# Patient Record
Sex: Male | Born: 1960 | Race: Black or African American | Hispanic: No | Smoking: Never smoker
Health system: Southern US, Community
[De-identification: ages and names within clinical notes are randomized; demographics above are authoritative.]

## PROBLEM LIST (undated history)

## (undated) DIAGNOSIS — E7800 Pure hypercholesterolemia, unspecified: Secondary | ICD-10-CM

## (undated) DIAGNOSIS — K4090 Unilateral inguinal hernia, without obstruction or gangrene, not specified as recurrent: Secondary | ICD-10-CM

## (undated) DIAGNOSIS — E119 Type 2 diabetes mellitus without complications: Secondary | ICD-10-CM

## (undated) DIAGNOSIS — I10 Essential (primary) hypertension: Secondary | ICD-10-CM

## (undated) DIAGNOSIS — C801 Malignant (primary) neoplasm, unspecified: Secondary | ICD-10-CM

## (undated) DIAGNOSIS — S02609A Fracture of mandible, unspecified, initial encounter for closed fracture: Secondary | ICD-10-CM

## (undated) HISTORY — PX: HERNIA REPAIR: SHX51

## (undated) HISTORY — PX: TRACHEOSTOMY: SUR1362

---

## 2008-01-31 ENCOUNTER — Inpatient Hospital Stay (HOSPITAL_COMMUNITY): Admission: EM | Admit: 2008-01-31 | Discharge: 2008-02-15 | Payer: Self-pay | Admitting: Emergency Medicine

## 2008-02-29 ENCOUNTER — Ambulatory Visit (HOSPITAL_COMMUNITY): Admission: RE | Admit: 2008-02-29 | Discharge: 2008-02-29 | Payer: Self-pay | Admitting: General Surgery

## 2008-03-19 ENCOUNTER — Ambulatory Visit (HOSPITAL_COMMUNITY): Admission: RE | Admit: 2008-03-19 | Discharge: 2008-03-19 | Payer: Self-pay | Admitting: Otolaryngology

## 2008-03-28 ENCOUNTER — Ambulatory Visit (HOSPITAL_BASED_OUTPATIENT_CLINIC_OR_DEPARTMENT_OTHER): Admission: RE | Admit: 2008-03-28 | Discharge: 2008-03-28 | Payer: Self-pay | Admitting: Otolaryngology

## 2008-05-02 ENCOUNTER — Ambulatory Visit (HOSPITAL_COMMUNITY): Admission: RE | Admit: 2008-05-02 | Discharge: 2008-05-02 | Payer: Self-pay | Admitting: Surgery
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
## 2011-01-27 NOTE — Op Note (Signed)
Stephen Craig, AWBREY                 ACCOUNT NO.:  [id number]   MEDICAL RECORD NO.:  [id number]          PATIENT TYPE:  AMB   LOCATION:  DAY                          FACILITY:  [facility]   PHYSICIAN:  Wilmon Arms. Corliss Skains, M.D. DATE OF BIRTH:  1961-04-24   DATE OF PROCEDURE:  05/02/2008  DATE OF DISCHARGE:                               OPERATIVE REPORT   PREOPERATIVE DIAGNOSIS:  Right inguinal hernia.   POSTOPERATIVE DIAGNOSIS:  Right direct inguinal hernia.   PROCEDURE:  Right inguinal hernia repair with mesh.   SURGEON:  Wilmon Arms. Tsuei, M.D.   ANESTHESIA:  General.   INDICATIONS:  The patient is a 50 year old male who has had a right  inguinal hernia for several years.  It has gotten larger and has become  somewhat uncomfortable.  He presents now for elective repair.   DESCRIPTION OF PROCEDURE:  The patient brought to the operating room,  placed in the supine position on operating room table.  After an  adequate level of general anesthesia was obtained, the patient's right  groin was shaved, prepped with Betadine and draped in sterile fashion.  Time-out was taken to ensure proper patient and proper procedure.  The  area above the right inguinal ligament was infiltrated 25% Marcaine with  epinephrine.  An oblique incision was made above the inguinal ligament.  Dissection was carried down subcutaneous tissues with cautery.  We  dissected down to the external oblique fascia which was opened along the  direction of its fibers down to the external ring.  A large hernia bulge  was seen coming through the external ring.  We bluntly dissected around  the hernia sac.  This was a very large direct hernia sac which was  adherent to the spermatic cord.  We dissected this free from the  spermatic cord.  There was no evidence of an indirect hernia sac.  We  reduced the direct hernia and held in place with a sponge stick.  We  then reapproximated the floor of the inguinal canal with a 0 PDS  suture.  Ultrapro mesh was cut in a keyhole shape and secured beginning at the  pubic tubercle with 2-0 Prolene suture.  We ran this along the internal  oblique fascia superiorly and the shelving edge inferiorly.  The tails  sutured together behind the spermatic cord and tucked down underneath  the external oblique fascia.  The fascia was reapproximated with 2-0  Vicryl.  I used 3-0 Vicryls to close subcutaneous tissues and 4-0  Monocryl was used to close the skin.  Steri-Strips and clean dressings  were applied.  The patient was then extubated and brought to the  recovery room in stable condition.  All sponge, instrument and needle  counts were correct.      Wilmon Arms. Tsuei, M.D.  Electronically Signed     MKT/MEDQ  D:  05/02/2008  T:  05/02/2008  Job:  [id number]

## 2011-06-10 LAB — URINALYSIS, ROUTINE W REFLEX MICROSCOPIC
Bilirubin Urine: NEGATIVE
Bilirubin Urine: NEGATIVE
Glucose, UA: NEGATIVE
Glucose, UA: NEGATIVE
Ketones, ur: NEGATIVE
Protein, ur: 30 — AB
Specific Gravity, Urine: 1.021
pH: 6

## 2011-06-10 LAB — CBC
HCT: 35 — ABNORMAL LOW
HCT: 36.8 — ABNORMAL LOW
HCT: 37.5 — ABNORMAL LOW
HCT: 40
Hemoglobin: 11.8 — ABNORMAL LOW
Hemoglobin: 12.1 — ABNORMAL LOW
Hemoglobin: 12.6 — ABNORMAL LOW
Hemoglobin: 13.6
MCHC: 33.7
MCHC: 34
MCHC: 34.3
MCV: 89.3
MCV: 89.7
Platelets: 172
Platelets: 218
RBC: 3.71 — ABNORMAL LOW
RBC: 3.91 — ABNORMAL LOW
RBC: 4.1 — ABNORMAL LOW
RDW: 12.9
RDW: 13.5
WBC: 10.6 — ABNORMAL HIGH
WBC: 8.6
WBC: 9.8

## 2011-06-10 LAB — BASIC METABOLIC PANEL
CO2: 30
Calcium: 8.1 — ABNORMAL LOW
Chloride: 99
Creatinine, Ser: 1.12
GFR calc Af Amer: >60
GFR calc non Af Amer: 57 — ABNORMAL LOW
GFR calc non Af Amer: >60
Glucose, Bld: 122 — ABNORMAL HIGH
Glucose, Bld: 189 — ABNORMAL HIGH
Potassium: 3.8
Potassium: 4.3
Sodium: 137
Sodium: 138

## 2011-06-10 LAB — POCT I-STAT 3, ART BLOOD GAS (G3+)
Acid-base deficit: 4 — ABNORMAL HIGH
Bicarbonate: 18.9 — ABNORMAL LOW
O2 Saturation: 100
Patient temperature: 98.6
TCO2: 20

## 2011-06-10 LAB — URINE MICROSCOPIC-ADD ON

## 2011-06-10 LAB — DIFFERENTIAL
Eosinophils Absolute: 0.2
Lymphocytes Relative: 10 — ABNORMAL LOW
Lymphs Abs: 1.1
Monocytes Relative: 11
Neutrophils Relative %: 77

## 2011-06-10 LAB — POCT I-STAT, CHEM 8
BUN: 24 — ABNORMAL HIGH
Calcium, Ion: 1.04 — ABNORMAL LOW
Creatinine, Ser: 1.3
Glucose, Bld: 214 — ABNORMAL HIGH
TCO2: 17

## 2011-06-12 LAB — POCT HEMOGLOBIN-HEMACUE: Hemoglobin: 13.7

## 2012-08-02 ENCOUNTER — Emergency Department (HOSPITAL_COMMUNITY)
Admission: EM | Admit: 2012-08-02 | Discharge: 2012-08-02 | Disposition: A | Discharge status: Home / Self Care | Payer: No Typology Code available for payment source | Attending: Emergency Medicine | Admitting: Emergency Medicine

## 2012-08-02 ENCOUNTER — Emergency Department (HOSPITAL_COMMUNITY): Payer: No Typology Code available for payment source

## 2012-08-02 ENCOUNTER — Encounter (HOSPITAL_COMMUNITY): Payer: Self-pay | Admitting: *Deleted

## 2012-08-02 DIAGNOSIS — M25559 Pain in unspecified hip: Secondary | ICD-10-CM

## 2012-08-02 DIAGNOSIS — R52 Pain, unspecified: Secondary | ICD-10-CM | POA: Insufficient documentation

## 2012-08-02 DIAGNOSIS — E7800 Pure hypercholesterolemia, unspecified: Secondary | ICD-10-CM | POA: Insufficient documentation

## 2012-08-02 DIAGNOSIS — E119 Type 2 diabetes mellitus without complications: Secondary | ICD-10-CM | POA: Insufficient documentation

## 2012-08-02 DIAGNOSIS — I10 Essential (primary) hypertension: Secondary | ICD-10-CM | POA: Insufficient documentation

## 2012-08-02 DIAGNOSIS — Z79899 Other long term (current) drug therapy: Secondary | ICD-10-CM | POA: Insufficient documentation

## 2012-08-02 DIAGNOSIS — Z8781 Personal history of (healed) traumatic fracture: Secondary | ICD-10-CM | POA: Insufficient documentation

## 2012-08-02 HISTORY — DX: Fracture of mandible, unspecified, initial encounter for closed fracture: S02.609A

## 2012-08-02 HISTORY — DX: Pure hypercholesterolemia, unspecified: E78.00

## 2012-08-02 HISTORY — DX: Essential (primary) hypertension: I10

## 2012-08-02 HISTORY — DX: Type 2 diabetes mellitus without complications: E11.9

## 2012-08-02 MED ORDER — MORPHINE SULFATE 4 MG/ML IJ SOLN
4.0000 mg | Freq: Once | INTRAMUSCULAR | Status: AC
  Administered 2012-08-02: 4 mg via INTRAMUSCULAR
  Filled 2012-08-02: qty 1

## 2012-08-02 MED ORDER — KETOROLAC TROMETHAMINE 30 MG/ML IJ SOLN
30.0000 mg | Freq: Once | INTRAMUSCULAR | Status: AC
Start: 2012-08-02 — End: 2012-08-02
  Administered 2012-08-02: 30 mg via INTRAMUSCULAR
  Filled 2012-08-02: qty 1

## 2012-08-02 MED ORDER — ONDANSETRON 4 MG PO TBDP
4.0000 mg | ORAL_TABLET | Freq: Once | ORAL | Status: AC
  Administered 2012-08-02: 4 mg via ORAL
  Filled 2012-08-02: qty 1

## 2012-08-02 MED ORDER — ONDANSETRON HCL 8 MG PO TABS: 4.0000 mg | ORAL_TABLET | Freq: Once | ORAL | Status: DC

## 2012-08-02 MED ORDER — IBUPROFEN 200 MG PO TABS
400.0000 mg | ORAL_TABLET | Freq: Once | ORAL | Status: AC
  Administered 2012-08-02: 400 mg via ORAL
  Filled 2012-08-02: qty 2

## 2012-08-02 MED ORDER — OXYCODONE-ACETAMINOPHEN 5-325 MG PO TABS
1.0000 | ORAL_TABLET | Freq: Once | ORAL | Status: AC
  Administered 2012-08-02: 1 via ORAL
  Filled 2012-08-02: qty 1

## 2012-08-02 MED ORDER — NAPROXEN 375 MG PO TABS: 375.0000 mg | ORAL_TABLET | Freq: Two times a day (BID) | ORAL | Status: DC

## 2012-08-02 MED ORDER — HYDROCODONE-ACETAMINOPHEN 5-500 MG PO TABS: 1.0000 | ORAL_TABLET | Freq: Four times a day (QID) | ORAL | Status: DC | PRN

## 2012-08-02 NOTE — ED Notes (Addendum)
cbg reported as 129. To xray via w/c.

## 2012-08-02 NOTE — ED Notes (Signed)
The patient is AOx4 and comfortable with his discharge instructions.  His ride home is present. 

## 2012-08-02 NOTE — ED Notes (Addendum)
C/o R hip pain, sudden onset, pinpoints to R hip, denies radiation or discomforts in leg or foot. (denies: recent injury, nvd, fever, urinary sx, bleeding, back pain, weakness or other sx). Onset today. Worse with movement, walking or lifting leg. No meds PTA. Alert, NAD, calm, interactive, uncomfortable sitting in w/c, guarding R hip. Has had episodes of high BP in past, but denies having htn.

## 2012-08-02 NOTE — ED Provider Notes (Addendum)
History     CSN: [id number]  Arrival date & time 08/02/12  0050   First MD Initiated Contact with Patient 08/02/12 [phone]      Chief Complaint  Patient presents with  . Hip Pain    (Consider location/radiation/quality/duration/timing/severity/associated sxs/prior treatment) Patient is a 51 y.o. male presenting with hip pain. The history is provided by the patient.  Hip Pain This is a new problem. The current episode started yesterday. The problem occurs constantly. The problem has not changed since onset.Pertinent negatives include no chest pain, no abdominal pain and no shortness of breath. The symptoms are aggravated by bending. Nothing relieves the symptoms.    Past Medical History  Diagnosis Date  . Diabetes mellitus without complication   . Hypertension   . Hypercholesterolemia   . Mandible fracture     Past Surgical History  Procedure Date  . Tracheostomy   . Hernia repair     No family history on file.  History  Substance Use Topics  . Smoking status: Never Smoker   . Smokeless tobacco: Not on file  . Alcohol Use: No      Review of Systems  Respiratory: Negative for shortness of breath.   Cardiovascular: Negative for chest pain.  Gastrointestinal: Negative for abdominal pain.  All other systems reviewed and are negative.    Allergies  Review of patient's allergies indicates no known allergies.  Home Medications   Current Outpatient Rx  Name  Route  Sig  Dispense  Refill  . LISINOPRIL 10 MG PO TABS   Oral   Take 10 mg by mouth daily.         Marland Kitchen METFORMIN HCL 1000 MG PO TABS   Oral   Take 1,000 mg by mouth 2 (two) times daily with a meal.         . PRAVASTATIN SODIUM 40 MG PO TABS   Oral   Take 80 mg by mouth daily.           BP 173/107  Pulse 93  Temp 97.7 F (36.5 C) (Oral)  Resp 16  SpO2 98%  Physical Exam  Constitutional: He is oriented to person, place, and time. He appears well-developed and well-nourished.  HENT:    Head: Normocephalic and atraumatic.  Eyes: Conjunctivae normal are normal. Pupils are equal, round, and reactive to light.  Neck: Normal range of motion. Neck supple.  Cardiovascular: Normal rate, regular rhythm, normal heart sounds and intact distal pulses.   Pulmonary/Chest: Effort normal and breath sounds normal.  Abdominal: Soft. Bowel sounds are normal.  Musculoskeletal:       Rt hip tenderness  Neurological: He is alert and oriented to person, place, and time.  Skin: Skin is warm and dry.  Psychiatric: He has a normal mood and affect. His behavior is normal. Judgment and thought content normal.    ED Course  Procedures (including critical care time)  Labs Reviewed - No data to display Dg Hip Complete Right  08/02/2012  *RADIOLOGY REPORT*  Clinical Data: Right hip pain, no known injury  RIGHT HIP - COMPLETE 2+ VIEW  Comparison: None Correlation:  CT pelvis 02/01/2008  Findings: Symmetric hip and SI joints. Osseous mineralization grossly normal. No acute fracture, dislocation, or bone destruction. Small pelvic phleboliths. Bone island right pelvis stable.  IMPRESSION: No acute abnormalities.   Original Report Authenticated By: Ulyses Southward, M.D.      No diagnosis found.    MDM  + rt hip pain.  Hx of  diabetes,  No fever.  Xray neg.  Sugar not elevated.  Will analgesia, dc to oupt fu.  No cp, no sob, no leg swelling,   No sxs of epidural compression or vascular insufficency       Anyae Griffith Lytle Michaels, MD 08/02/12 0244  Dalma Panchal Lytle Michaels, MD 08/02/12 [phone]

## 2017-06-21 ENCOUNTER — Ambulatory Visit
Admission: RE | Admit: 2017-06-21 | Discharge: 2017-06-21 | Disposition: A | Discharge status: Home / Self Care | Payer: PRIVATE HEALTH INSURANCE | Source: Ambulatory Visit | Attending: Family Medicine | Admitting: Family Medicine

## 2017-06-21 ENCOUNTER — Other Ambulatory Visit: Payer: Self-pay | Admitting: Family Medicine

## 2017-06-21 DIAGNOSIS — M5489 Other dorsalgia: Secondary | ICD-10-CM

## 2017-07-28 ENCOUNTER — Other Ambulatory Visit: Payer: Self-pay | Admitting: Urology

## 2017-09-22 NOTE — Patient Instructions (Signed)
Your procedure is scheduled on: Friday, Jan. 18, 2019   Surgery Time:  7:30AM-11:30AM   Report to [facility]  Entrance   Follow map to Short Stay on first floor at 5:30 AM   Call this number if you have problems the morning of surgery [phone]   Do not eat food or drink liquids :After Midnight.   Do NOT smoke after Midnight   Do NOT take evening dose of Metformin the night before surgery    FLEETS ENEMA THE MORNING OF SURGERY   Take these medicines the morning of surgery with A SIP OF WATER: Pravastatin   DO NOT TAKE ANY DIABETIC MEDICATIONS DAY OF YOUR SURGERY                               You may not have any metal on your body including jewelry, and body piercings             Do not wear lotions, powders, perfumes/cologne, or deodorant                          Men may shave face and neck.   Do not bring valuables to the hospital. [facility].   Contacts, dentures or bridgework may not be worn into surgery.   Leave suitcase in the car. After surgery it may be brought to your room.     Special Instructions: Bring a copy of your healthcare power of attorney and living will documents         the day of surgery if you haven't scanned them in before.              Please read over the following fact sheets you were given:  [facility] - Preparing for Surgery Before surgery, you can play an important role.  Because skin is not sterile, your skin needs to be as free of germs as possible.  You can reduce the number of germs on your skin by washing with CHG (chlorahexidine gluconate) soap before surgery.  CHG is an antiseptic cleaner which kills germs and bonds with the skin to continue killing germs even after washing. Please DO NOT use if you have an allergy to CHG or antibacterial soaps.  If your skin becomes reddened/irritated stop using the CHG and inform your nurse when you arrive at Short Stay. Do not  shave (including legs and underarms) for at least 48 hours prior to the first CHG shower.  You may shave your face/neck.  Please follow these instructions carefully:  1.  Shower with CHG Soap the night before surgery and the  morning of surgery.  2.  If you choose to wash your hair, wash your hair first as usual with your normal  shampoo.  3.  After you shampoo, rinse your hair and body thoroughly to remove the shampoo.                             4.  Use CHG as you would any other liquid soap.  You can apply chg directly to the skin and wash.  Gently with a scrungie or clean washcloth.  5.  Apply the CHG Soap to your body ONLY FROM THE NECK DOWN.  Do   not use on face/ open                           Wound or open sores. Avoid contact with eyes, ears mouth and   genitals (private parts).                       Wash face,  Genitals (private parts) with your normal soap.             6.  Wash thoroughly, paying special attention to the area where your    surgery  will be performed.  7.  Thoroughly rinse your body with warm water from the neck down.  8.  DO NOT shower/wash with your normal soap after using and rinsing off the CHG Soap.                9.  Pat yourself dry with a clean towel.            10.  Wear clean pajamas.            11.  Place clean sheets on your bed the night of your first shower and do not  sleep with pets. Day of Surgery : Do not apply any lotions/deodorants the morning of surgery.  Please wear clean clothes to the hospital/surgery center.  FAILURE TO FOLLOW THESE INSTRUCTIONS MAY RESULT IN THE CANCELLATION OF YOUR SURGERY  PATIENT SIGNATURE_________________________________  NURSE SIGNATURE__________________________________  ________________________________________________________________________ How to Manage Your Diabetes Before and After Surgery  Why is it important to control my blood sugar before and after surgery? . Improving blood sugar levels before and  after surgery helps healing and can limit problems. . A way of improving blood sugar control is eating a healthy diet by: o  Eating less sugar and carbohydrates o  Increasing activity/exercise o  Talking with your doctor about reaching your blood sugar goals . High blood sugars (greater than 180 mg/dL) can raise your risk of infections and slow your recovery, so you will need to focus on controlling your diabetes during the weeks before surgery. . Make sure that the doctor who takes care of your diabetes knows about your planned surgery including the date and location.  How do I manage my blood sugar before surgery? . Check your blood sugar at least 4 times a day, starting 2 days before surgery, to make sure that the level is not too high or low. o Check your blood sugar the morning of your surgery when you wake up and every 2 hours until you get to the Short Stay unit. . If your blood sugar is less than 70 mg/dL, you will need to treat for low blood sugar: o Do not take insulin. o Treat a low blood sugar (less than 70 mg/dL) with  cup of clear juice (cranberry or apple), 4 glucose tablets, OR glucose gel. o Recheck blood sugar in 15 minutes after treatment (to make sure it is greater than 70 mg/dL). If your blood sugar is not greater than 70 mg/dL on recheck, call [phone] for further instructions. . Report your blood sugar to the short stay nurse when you get to Short Stay.  . If you are admitted to the hospital after surgery: o Your blood sugar will be checked by the staff and you will probably be given insulin after surgery (instead of oral diabetes medicines) to make sure you have good blood sugar  levels. o The goal for blood sugar control after surgery is 80-180 mg/dL.    WHAT IS A BLOOD TRANSFUSION? Blood Transfusion Information  A transfusion is the replacement of blood or some of its parts. Blood is made up of multiple cells which provide different functions.  Red blood cells  carry oxygen and are used for blood loss replacement.  White blood cells fight against infection.  Platelets control bleeding.  Plasma helps clot blood.  Other blood products are available for specialized needs, such as hemophilia or other clotting disorders. BEFORE THE TRANSFUSION  Who gives blood for transfusions?   Healthy volunteers who are fully evaluated to make sure their blood is safe. This is blood bank blood. Transfusion therapy is the safest it has ever been in the practice of medicine. Before blood is taken from a donor, a complete history is taken to make sure that person has no history of diseases nor engages in risky social behavior (examples are intravenous drug use or sexual activity with multiple partners). The donor's travel history is screened to minimize risk of transmitting infections, such as malaria. The donated blood is tested for signs of infectious diseases, such as HIV and hepatitis. The blood is then tested to be sure it is compatible with you in order to minimize the chance of a transfusion reaction. If you or a relative donates blood, this is often done in anticipation of surgery and is not appropriate for emergency situations. It takes many days to process the donated blood. RISKS AND COMPLICATIONS Although transfusion therapy is very safe and saves many lives, the main dangers of transfusion include:   Getting an infectious disease.  Developing a transfusion reaction. This is an allergic reaction to something in the blood you were given. Every precaution is taken to prevent this. The decision to have a blood transfusion has been considered carefully by your caregiver before blood is given. Blood is not given unless the benefits outweigh the risks. AFTER THE TRANSFUSION  Right after receiving a blood transfusion, you will usually feel much better and more energetic. This is especially true if your red blood cells have gotten low (anemic). The transfusion raises  the level of the red blood cells which carry oxygen, and this usually causes an energy increase.  The nurse administering the transfusion will monitor you carefully for complications. HOME CARE INSTRUCTIONS  No special instructions are needed after a transfusion. You may find your energy is better. Speak with your caregiver about any limitations on activity for underlying diseases you may have. SEEK MEDICAL CARE IF:   Your condition is not improving after your transfusion.  You develop redness or irritation at the intravenous (IV) site. SEEK IMMEDIATE MEDICAL CARE IF:  Any of the following symptoms occur over the next 12 hours:  Shaking chills.  You have a temperature by mouth above 102 F (38.9 C), not controlled by medicine.  Chest, back, or muscle pain.  People around you feel you are not acting correctly or are confused.  Shortness of breath or difficulty breathing.  Dizziness and fainting.  You get a rash or develop hives.  You have a decrease in urine output.  Your urine turns a dark color or changes to pink, red, or brown. Any of the following symptoms occur over the next 10 days:  You have a temperature by mouth above 102 F (38.9 C), not controlled by medicine.  Shortness of breath.  Weakness after normal activity.  The white part of the  eye turns yellow (jaundice).  You have a decrease in the amount of urine or are urinating less often.  Your urine turns a dark color or changes to pink, red, or brown. Document Released: 08/28/2000 Document Revised: 11/23/2011 Document Reviewed: 04/16/2008 [facility] Patient Information 2014 [place], [place].  _______________________________________________________________________

## 2017-09-24 ENCOUNTER — Inpatient Hospital Stay (HOSPITAL_COMMUNITY)
Admission: RE | Admit: 2017-09-24 | Discharge: 2017-09-24 | Disposition: A | Discharge status: Home / Self Care | Payer: PRIVATE HEALTH INSURANCE | Source: Ambulatory Visit

## 2017-09-24 NOTE — Patient Instructions (Signed)
Stephen Craig  09/24/2017   Your procedure is scheduled on: 10/01/17   Report to [facility] Main  Entrance  Follow signs to Short Stay on first floor at 530 AM  Call this number if you have problems the morning of surgery [phone]   Remember: Do not eat food or drink liquids :After Midnight.  Fleets enema am of surgery    Take these medicines the morning of surgery with A SIP OF WATER: Pravastatin  DO NOT TAKE ANY DIABETIC MEDICATIONS DAY OF YOUR SURGERY                               You may not have any metal on your body including hair pins and              piercings  Do not wear jewelry, make-up, lotions, powders or perfumes, deodorant             Do not wear nail polish.  Do not shave  48 hours prior to surgery.              Men may shave face and neck.   Do not bring valuables to the hospital. [place].  Contacts, dentures or bridgework may not be worn into surgery.  Leave suitcase in the car. After surgery it may be brought to your room.     Patients discharged the day of surgery will not be allowed to drive home.  Name and phone number of your driver:  Special Instructions: N/A              Please read over the following fact sheets you were given: _____________________________________________________________________             [facility] - Preparing for Surgery Before surgery, you can play an important role.  Because skin is not sterile, your skin needs to be as free of germs as possible.  You can reduce the number of germs on your skin by washing with CHG (chlorahexidine gluconate) soap before surgery.  CHG is an antiseptic cleaner which kills germs and bonds with the skin to continue killing germs even after washing. Please DO NOT use if you have an allergy to CHG or antibacterial soaps.  If your skin becomes reddened/irritated stop using the CHG and inform your nurse when you arrive at  Short Stay. Do not shave (including legs and underarms) for at least 48 hours prior to the first CHG shower.  You may shave your face/neck. Please follow these instructions carefully:  1.  Shower with CHG Soap the night before surgery and the  morning of Surgery.  2.  If you choose to wash your hair, wash your hair first as usual with your  normal  shampoo.  3.  After you shampoo, rinse your hair and body thoroughly to remove the  shampoo.                           4.  Use CHG as you would any other liquid soap.  You can apply chg directly  to the skin and wash  Gently with a scrungie or clean washcloth.  5.  Apply the CHG Soap to your body ONLY FROM THE NECK DOWN.   Do not use on face/ open                           Wound or open sores. Avoid contact with eyes, ears mouth and genitals (private parts).                       Wash face,  Genitals (private parts) with your normal soap.             6.  Wash thoroughly, paying special attention to the area where your surgery  will be performed.  7.  Thoroughly rinse your body with warm water from the neck down.  8.  DO NOT shower/wash with your normal soap after using and rinsing off  the CHG Soap.                9.  Pat yourself dry with a clean towel.            10.  Wear clean pajamas.            11.  Place clean sheets on your bed the night of your first shower and do not  sleep with pets. Day of Surgery : Do not apply any lotions/deodorants the morning of surgery.  Please wear clean clothes to the hospital/surgery center.  FAILURE TO FOLLOW THESE INSTRUCTIONS MAY RESULT IN THE CANCELLATION OF YOUR SURGERY PATIENT SIGNATURE_________________________________  NURSE SIGNATURE__________________________________  ________________________________________________________________________  WHAT IS A BLOOD TRANSFUSION? Blood Transfusion Information  A transfusion is the replacement of blood or some of its parts. Blood is made up  of multiple cells which provide different functions.  Red blood cells carry oxygen and are used for blood loss replacement.  White blood cells fight against infection.  Platelets control bleeding.  Plasma helps clot blood.  Other blood products are available for specialized needs, such as hemophilia or other clotting disorders. BEFORE THE TRANSFUSION  Who gives blood for transfusions?   Healthy volunteers who are fully evaluated to make sure their blood is safe. This is blood bank blood. Transfusion therapy is the safest it has ever been in the practice of medicine. Before blood is taken from a donor, a complete history is taken to make sure that person has no history of diseases nor engages in risky social behavior (examples are intravenous drug use or sexual activity with multiple partners). The donor's travel history is screened to minimize risk of transmitting infections, such as malaria. The donated blood is tested for signs of infectious diseases, such as HIV and hepatitis. The blood is then tested to be sure it is compatible with you in order to minimize the chance of a transfusion reaction. If you or a relative donates blood, this is often done in anticipation of surgery and is not appropriate for emergency situations. It takes many days to process the donated blood. RISKS AND COMPLICATIONS Although transfusion therapy is very safe and saves many lives, the main dangers of transfusion include:   Getting an infectious disease.  Developing a transfusion reaction. This is an allergic reaction to something in the blood you were given. Every precaution is taken to prevent this. The decision to have a blood transfusion has been considered carefully by your caregiver before blood is given. Blood is not given unless the benefits outweigh  the risks. AFTER THE TRANSFUSION  Right after receiving a blood transfusion, you will usually feel much better and more energetic. This is especially true  if your red blood cells have gotten low (anemic). The transfusion raises the level of the red blood cells which carry oxygen, and this usually causes an energy increase.  The nurse administering the transfusion will monitor you carefully for complications. HOME CARE INSTRUCTIONS  No special instructions are needed after a transfusion. You may find your energy is better. Speak with your caregiver about any limitations on activity for underlying diseases you may have. SEEK MEDICAL CARE IF:   Your condition is not improving after your transfusion.  You develop redness or irritation at the intravenous (IV) site. SEEK IMMEDIATE MEDICAL CARE IF:  Any of the following symptoms occur over the next 12 hours:  Shaking chills.  You have a temperature by mouth above 102 F (38.9 C), not controlled by medicine.  Chest, back, or muscle pain.  People around you feel you are not acting correctly or are confused.  Shortness of breath or difficulty breathing.  Dizziness and fainting.  You get a rash or develop hives.  You have a decrease in urine output.  Your urine turns a dark color or changes to pink, red, or brown. Any of the following symptoms occur over the next 10 days:  You have a temperature by mouth above 102 F (38.9 C), not controlled by medicine.  Shortness of breath.  Weakness after normal activity.  The white part of the eye turns yellow (jaundice).  You have a decrease in the amount of urine or are urinating less often.  Your urine turns a dark color or changes to pink, red, or brown. Document Released: 08/28/2000 Document Revised: 11/23/2011 Document Reviewed: 04/16/2008 [facility] Patient Information 2014 [place], [place].  _______________________________________________________________________

## 2017-09-27 ENCOUNTER — Encounter (INDEPENDENT_AMBULATORY_CARE_PROVIDER_SITE_OTHER): Payer: Self-pay

## 2017-09-27 ENCOUNTER — Inpatient Hospital Stay (HOSPITAL_COMMUNITY)
Admission: RE | Admit: 2017-09-27 | Discharge: 2017-09-27 | Disposition: A | Discharge status: Home / Self Care | Payer: PRIVATE HEALTH INSURANCE | Source: Ambulatory Visit

## 2017-09-27 ENCOUNTER — Encounter (HOSPITAL_COMMUNITY): Payer: Self-pay

## 2017-09-27 ENCOUNTER — Other Ambulatory Visit: Payer: Self-pay

## 2017-09-27 ENCOUNTER — Encounter (HOSPITAL_COMMUNITY)
Admission: RE | Admit: 2017-09-27 | Discharge: 2017-09-27 | Disposition: A | Discharge status: Home / Self Care | Payer: PRIVATE HEALTH INSURANCE | Source: Ambulatory Visit | Attending: Urology | Admitting: Urology

## 2017-09-27 DIAGNOSIS — E119 Type 2 diabetes mellitus without complications: Secondary | ICD-10-CM | POA: Diagnosis not present

## 2017-09-27 DIAGNOSIS — Z01812 Encounter for preprocedural laboratory examination: Secondary | ICD-10-CM | POA: Diagnosis not present

## 2017-09-27 DIAGNOSIS — Z7984 Long term (current) use of oral hypoglycemic drugs: Secondary | ICD-10-CM | POA: Insufficient documentation

## 2017-09-27 DIAGNOSIS — Z01810 Encounter for preprocedural cardiovascular examination: Secondary | ICD-10-CM | POA: Diagnosis present

## 2017-09-27 DIAGNOSIS — C61 Malignant neoplasm of prostate: Secondary | ICD-10-CM | POA: Insufficient documentation

## 2017-09-27 DIAGNOSIS — I10 Essential (primary) hypertension: Secondary | ICD-10-CM | POA: Insufficient documentation

## 2017-09-27 HISTORY — DX: Unilateral inguinal hernia, without obstruction or gangrene, not specified as recurrent: K40.90

## 2017-09-27 HISTORY — DX: Malignant (primary) neoplasm, unspecified: C80.1

## 2017-09-27 LAB — COMPREHENSIVE METABOLIC PANEL
ALBUMIN: 3.7 g/dL (ref 3.5–5.0)
ALK PHOS: 66 U/L (ref 38–126)
ALT: 15 U/L — AB (ref 17–63)
ANION GAP: 8 (ref 5–15)
AST: 21 U/L (ref 15–41)
BUN: 22 mg/dL — ABNORMAL HIGH (ref 6–20)
CALCIUM: 9.2 mg/dL (ref 8.9–10.3)
CHLORIDE: 106 mmol/L (ref 101–111)
CO2: 25 mmol/L (ref 22–32)
CREATININE: 1.37 mg/dL — AB (ref 0.61–1.24)
GFR calc Af Amer: >60 mL/min (ref >60)
GFR calc non Af Amer: 56 mL/min — ABNORMAL LOW (ref >60)
GLUCOSE: 183 mg/dL — AB (ref 65–99)
Potassium: 3.5 mmol/L (ref 3.5–5.1)
Sodium: 139 mmol/L (ref 135–145)
Total Bilirubin: 0.4 mg/dL (ref 0.3–1.2)
Total Protein: 7.2 g/dL (ref 6.5–8.1)

## 2017-09-27 LAB — CBC
HCT: 41 % (ref 39.0–52.0)
HEMOGLOBIN: 13.3 g/dL (ref 13.0–17.0)
MCH: 29 pg (ref 26.0–34.0)
MCHC: 32.4 g/dL (ref 30.0–36.0)
MCV: 89.5 fL (ref 78.0–100.0)
PLATELETS: 238 10*3/uL (ref 150–400)
RBC: 4.58 MIL/uL (ref 4.22–5.81)
RDW: 13.4 % (ref 11.5–15.5)
WBC: 6 10*3/uL (ref 4.0–10.5)

## 2017-09-27 LAB — ABO/RH: ABO/RH(D): B POS

## 2017-09-27 LAB — GLUCOSE, CAPILLARY: GLUCOSE-CAPILLARY: 189 mg/dL — AB (ref 65–99)

## 2017-09-27 LAB — HEMOGLOBIN A1C
Hgb A1c MFr Bld: 7 % — ABNORMAL HIGH (ref 4.8–5.6)
Mean Plasma Glucose: 154.2 mg/dL

## 2017-09-27 MED ORDER — FLEET ENEMA 7-19 GM/118ML RE ENEM
1.0000 | ENEMA | Freq: Once | RECTAL | Status: DC
  Filled 2017-09-27: qty 1

## 2017-09-27 NOTE — Pre-Procedure Instructions (Signed)
CMP and Hgb A1C 09/27/17 results faxed to Dr. Louis Meckel and Dr. Alroy Dust via epic.

## 2017-09-27 NOTE — Patient Instructions (Signed)
Your procedure is scheduled on: Friday, Jan. 18, 2019   Surgery Time:  7:30AM-11:30AM   Report to [facility]   Follow map to Short Stay on first floor at 5:30 AM   Call this number if you have problems the morning of surgery [phone]   Do not eat food or drink liquids :After Midnight.   Do NOT smoke after [place]   Take these medicines the morning of surgery with A SIP OF WATER: Pravastatin   DO NOT TAKE EVENING DOSE OF METFORMIN THE NIGHT BEFORE SURGERY   DO NOT TAKE ANY DIABETIC MEDICATIONS DAY OF YOUR SURGERY                               You may not have any metal on your body including jewelry, and body piercings             Do not wear make-up, lotions, powders, perfumes/cologne, or deodorant                          Men may shave face and neck.   Do not bring valuables to the hospital. [place].   Contacts, dentures or bridgework may not be worn into surgery.   Leave suitcase in the car. After surgery it may be brought to your room.   Special Instructions: Bring a copy of your healthcare power of attorney and living will documents         the day of surgery if you haven't scanned them in before.              Please read over the following fact sheets you were given:   [facility] - Preparing for Surgery Before surgery, you can play an important role.  Because skin is not sterile, your skin needs to be as free of germs as possible.  You can reduce the number of germs on your skin by washing with CHG (chlorahexidine gluconate) soap before surgery.  CHG is an antiseptic cleaner which kills germs and bonds with the skin to continue killing germs even after washing. Please DO NOT use if you have an allergy to CHG or antibacterial soaps.  If your skin becomes reddened/irritated stop using the CHG and inform your nurse when you arrive at Short Stay. Do  not shave (including legs and underarms) for at least 48 hours prior to the first CHG shower.  You may shave your face/neck.  Please follow these instructions carefully:  1.  Shower with CHG Soap the night before surgery and the  morning of surgery.  2.  If you choose to wash your hair, wash your hair first as usual with your normal  shampoo.  3.  After you shampoo, rinse your hair and body thoroughly to remove the shampoo.                             4.  Use CHG as you would any other liquid soap.  You can apply chg directly to the skin and wash.  Gently with a scrungie or clean washcloth.  5.  Apply the CHG Soap to your body ONLY FROM THE NECK DOWN.  Do   not use on face/ open                           Wound or open sores. Avoid contact with eyes, ears mouth and   genitals (private parts).                       Wash face,  Genitals (private parts) with your normal soap.             6.  Wash thoroughly, paying special attention to the area where your    surgery  will be performed.  7.  Thoroughly rinse your body with warm water from the neck down.  8.  DO NOT shower/wash with your normal soap after using and rinsing off the CHG Soap.                9.  Pat yourself dry with a clean towel.            10.  Wear clean pajamas.            11.  Place clean sheets on your bed the night of your first shower and do not  sleep with pets. Day of Surgery : Do not apply any lotions/deodorants the morning of surgery.  Please wear clean clothes to the hospital/surgery center.  FAILURE TO FOLLOW THESE INSTRUCTIONS MAY RESULT IN THE CANCELLATION OF YOUR SURGERY  PATIENT SIGNATURE_________________________________  NURSE SIGNATURE__________________________________  ________________________________________________________________________

## 2017-10-01 ENCOUNTER — Encounter (HOSPITAL_COMMUNITY): Payer: Self-pay | Admitting: *Deleted

## 2017-10-01 ENCOUNTER — Other Ambulatory Visit: Payer: Self-pay

## 2017-10-01 ENCOUNTER — Observation Stay (HOSPITAL_COMMUNITY)
Admission: RE | Admit: 2017-10-01 | Discharge: 2017-10-02 | Disposition: A | Discharge status: Home / Self Care | Payer: PRIVATE HEALTH INSURANCE | Source: Ambulatory Visit | Attending: Urology | Admitting: Urology

## 2017-10-01 ENCOUNTER — Ambulatory Visit (HOSPITAL_COMMUNITY): Payer: PRIVATE HEALTH INSURANCE | Admitting: Certified Registered Nurse Anesthetist

## 2017-10-01 ENCOUNTER — Encounter (HOSPITAL_COMMUNITY)
Admission: RE | Disposition: A | Discharge status: Home / Self Care | Payer: Self-pay | Source: Ambulatory Visit | Attending: Urology

## 2017-10-01 DIAGNOSIS — C61 Malignant neoplasm of prostate: Principal | ICD-10-CM | POA: Diagnosis present

## 2017-10-01 DIAGNOSIS — Z79899 Other long term (current) drug therapy: Secondary | ICD-10-CM | POA: Diagnosis not present

## 2017-10-01 DIAGNOSIS — I10 Essential (primary) hypertension: Secondary | ICD-10-CM | POA: Diagnosis not present

## 2017-10-01 DIAGNOSIS — Z7984 Long term (current) use of oral hypoglycemic drugs: Secondary | ICD-10-CM | POA: Insufficient documentation

## 2017-10-01 DIAGNOSIS — E119 Type 2 diabetes mellitus without complications: Secondary | ICD-10-CM | POA: Diagnosis not present

## 2017-10-01 HISTORY — PX: LYMPHADENECTOMY: SHX5960

## 2017-10-01 HISTORY — PX: ROBOT ASSISTED LAPAROSCOPIC RADICAL PROSTATECTOMY: SHX5141

## 2017-10-01 LAB — TYPE AND SCREEN
ABO/RH(D): B POS
Antibody Screen: NEGATIVE

## 2017-10-01 LAB — HEMOGLOBIN AND HEMATOCRIT, BLOOD
HEMATOCRIT: 39.3 % (ref 39.0–52.0)
Hemoglobin: 12.8 g/dL — ABNORMAL LOW (ref 13.0–17.0)

## 2017-10-01 LAB — GLUCOSE, CAPILLARY
GLUCOSE-CAPILLARY: 133 mg/dL — AB (ref 65–99)
GLUCOSE-CAPILLARY: 165 mg/dL — AB (ref 65–99)
Glucose-Capillary: 140 mg/dL — ABNORMAL HIGH (ref 65–99)
Glucose-Capillary: 144 mg/dL — ABNORMAL HIGH (ref 65–99)

## 2017-10-01 SURGERY — PROSTATECTOMY, RADICAL, ROBOT-ASSISTED, LAPAROSCOPIC
Anesthesia: General

## 2017-10-01 MED ORDER — INSULIN ASPART 100 UNIT/ML ~~LOC~~ SOLN
0.0000 [IU] | Freq: Three times a day (TID) | SUBCUTANEOUS | Status: DC
  Administered 2017-10-01: 3 [IU] via SUBCUTANEOUS

## 2017-10-01 MED ORDER — SUGAMMADEX SODIUM 200 MG/2ML IV SOLN
INTRAVENOUS | Status: DC | PRN
  Administered 2017-10-01: 400 mg via INTRAVENOUS

## 2017-10-01 MED ORDER — STERILE WATER FOR IRRIGATION IR SOLN
Status: DC | PRN
  Administered 2017-10-01: 1000 mL

## 2017-10-01 MED ORDER — LABETALOL HCL 5 MG/ML IV SOLN
5.0000 mg | INTRAVENOUS | Status: DC | PRN
  Filled 2017-10-01: qty 4

## 2017-10-01 MED ORDER — PHENYLEPHRINE 40 MCG/ML (10ML) SYRINGE FOR IV PUSH (FOR BLOOD PRESSURE SUPPORT)
PREFILLED_SYRINGE | INTRAVENOUS | Status: AC
  Filled 2017-10-01: qty 10

## 2017-10-01 MED ORDER — FLEET ENEMA 7-19 GM/118ML RE ENEM
1.0000 | ENEMA | Freq: Once | RECTAL | Status: AC
  Administered 2017-10-01: 1 via RECTAL
  Filled 2017-10-01: qty 1

## 2017-10-01 MED ORDER — FENTANYL CITRATE (PF) 250 MCG/5ML IJ SOLN
INTRAMUSCULAR | Status: AC
  Filled 2017-10-01: qty 5

## 2017-10-01 MED ORDER — ACETAMINOPHEN 325 MG PO TABS: 650.0000 mg | ORAL_TABLET | ORAL | Status: DC | PRN

## 2017-10-01 MED ORDER — FENTANYL CITRATE (PF) 100 MCG/2ML IJ SOLN
INTRAMUSCULAR | Status: AC
  Filled 2017-10-01: qty 4

## 2017-10-01 MED ORDER — CEFAZOLIN SODIUM-DEXTROSE 2-4 GM/100ML-% IV SOLN
2.0000 g | INTRAVENOUS | Status: AC
  Administered 2017-10-01: 2 g via INTRAVENOUS
  Filled 2017-10-01: qty 100

## 2017-10-01 MED ORDER — KETOROLAC TROMETHAMINE 15 MG/ML IJ SOLN
15.0000 mg | Freq: Four times a day (QID) | INTRAMUSCULAR | Status: DC
  Administered 2017-10-01 – 2017-10-02 (×5): 15 mg via INTRAVENOUS
  Filled 2017-10-01 (×5): qty 1

## 2017-10-01 MED ORDER — FENTANYL CITRATE (PF) 100 MCG/2ML IJ SOLN
25.0000 ug | INTRAMUSCULAR | Status: DC | PRN
  Administered 2017-10-01: 25 ug via INTRAVENOUS

## 2017-10-01 MED ORDER — METFORMIN HCL 500 MG PO TABS
1000.0000 mg | ORAL_TABLET | Freq: Two times a day (BID) | ORAL | Status: DC
  Administered 2017-10-02 (×2): 1000 mg via ORAL
  Filled 2017-10-01 (×3): qty 2

## 2017-10-01 MED ORDER — SODIUM CHLORIDE 0.45 % IV SOLN
INTRAVENOUS | Status: DC
  Administered 2017-10-01 – 2017-10-02 (×2): via INTRAVENOUS

## 2017-10-01 MED ORDER — FENTANYL CITRATE (PF) 100 MCG/2ML IJ SOLN
INTRAMUSCULAR | Status: DC | PRN
  Administered 2017-10-01: 150 ug via INTRAVENOUS
  Administered 2017-10-01 (×2): 50 ug via INTRAVENOUS

## 2017-10-01 MED ORDER — LACTATED RINGERS IV SOLN
INTRAVENOUS | Status: DC
  Administered 2017-10-01 (×3): via INTRAVENOUS

## 2017-10-01 MED ORDER — ONDANSETRON HCL 4 MG/2ML IJ SOLN
INTRAMUSCULAR | Status: DC | PRN
  Administered 2017-10-01: 4 mg via INTRAVENOUS

## 2017-10-01 MED ORDER — ROCURONIUM BROMIDE 50 MG/5ML IV SOSY
PREFILLED_SYRINGE | INTRAVENOUS | Status: AC
  Filled 2017-10-01: qty 10

## 2017-10-01 MED ORDER — PROPOFOL 10 MG/ML IV BOLUS
INTRAVENOUS | Status: AC
  Filled 2017-10-01: qty 20

## 2017-10-01 MED ORDER — SODIUM CHLORIDE 0.9 % IJ SOLN
INTRAMUSCULAR | Status: AC
  Filled 2017-10-01: qty 10

## 2017-10-01 MED ORDER — ONDANSETRON HCL 4 MG/2ML IJ SOLN: 4.0000 mg | Freq: Once | INTRAMUSCULAR | Status: DC | PRN

## 2017-10-01 MED ORDER — ACETAMINOPHEN 10 MG/ML IV SOLN
1000.0000 mg | Freq: Four times a day (QID) | INTRAVENOUS | Status: AC
  Administered 2017-10-01 – 2017-10-02 (×4): 1000 mg via INTRAVENOUS
  Filled 2017-10-01 (×4): qty 100

## 2017-10-01 MED ORDER — BUPIVACAINE-EPINEPHRINE 0.5% -1:200000 IJ SOLN
INTRAMUSCULAR | Status: DC | PRN
  Administered 2017-10-01: 50 mL

## 2017-10-01 MED ORDER — ROCURONIUM BROMIDE 50 MG/5ML IV SOSY
PREFILLED_SYRINGE | INTRAVENOUS | Status: AC
  Filled 2017-10-01: qty 5

## 2017-10-01 MED ORDER — HYDROMORPHONE HCL 1 MG/ML IJ SOLN: 0.2500 mg | INTRAMUSCULAR | Status: DC | PRN

## 2017-10-01 MED ORDER — HYDROMORPHONE HCL 1 MG/ML IJ SOLN
INTRAMUSCULAR | Status: AC
  Filled 2017-10-01: qty 2

## 2017-10-01 MED ORDER — BUPIVACAINE-EPINEPHRINE 0.5% -1:200000 IJ SOLN
INTRAMUSCULAR | Status: AC
  Filled 2017-10-01: qty 1

## 2017-10-01 MED ORDER — LINAGLIPTIN 5 MG PO TABS
10.0000 mg | ORAL_TABLET | Freq: Every day | ORAL | Status: DC
  Administered 2017-10-02: 10 mg via ORAL
  Filled 2017-10-01: qty 2

## 2017-10-01 MED ORDER — DEXAMETHASONE SODIUM PHOSPHATE 10 MG/ML IJ SOLN
INTRAMUSCULAR | Status: AC
  Filled 2017-10-01: qty 1

## 2017-10-01 MED ORDER — MIDAZOLAM HCL 2 MG/2ML IJ SOLN
INTRAMUSCULAR | Status: AC
  Filled 2017-10-01: qty 2

## 2017-10-01 MED ORDER — LIDOCAINE 2% (20 MG/ML) 5 ML SYRINGE
INTRAMUSCULAR | Status: DC | PRN
  Administered 2017-10-01: 100 mg via INTRAVENOUS

## 2017-10-01 MED ORDER — LISINOPRIL 20 MG PO TABS
20.0000 mg | ORAL_TABLET | Freq: Every day | ORAL | Status: DC
  Administered 2017-10-01 – 2017-10-02 (×2): 20 mg via ORAL
  Filled 2017-10-01 (×2): qty 1

## 2017-10-01 MED ORDER — PROPOFOL 10 MG/ML IV BOLUS
INTRAVENOUS | Status: DC | PRN
  Administered 2017-10-01: 130 mg via INTRAVENOUS

## 2017-10-01 MED ORDER — PRAVASTATIN SODIUM 40 MG PO TABS
80.0000 mg | ORAL_TABLET | Freq: Every day | ORAL | Status: DC
  Administered 2017-10-01 – 2017-10-02 (×2): 80 mg via ORAL
  Filled 2017-10-01 (×2): qty 2

## 2017-10-01 MED ORDER — MIDAZOLAM HCL 5 MG/5ML IJ SOLN
INTRAMUSCULAR | Status: DC | PRN
  Administered 2017-10-01: 2 mg via INTRAVENOUS

## 2017-10-01 MED ORDER — LACTATED RINGERS IR SOLN
Status: DC | PRN
  Administered 2017-10-01: 1000 mL

## 2017-10-01 MED ORDER — TRAMADOL HCL 50 MG PO TABS
50.0000 mg | ORAL_TABLET | Freq: Four times a day (QID) | ORAL | Status: DC | PRN
  Administered 2017-10-01: 100 mg via ORAL
  Filled 2017-10-01: qty 2

## 2017-10-01 MED ORDER — TRAMADOL HCL 50 MG PO TABS: 50.0000 mg | ORAL_TABLET | Freq: Four times a day (QID) | ORAL | 0 refills | Status: DC | PRN

## 2017-10-01 MED ORDER — HYDROMORPHONE HCL 1 MG/ML IJ SOLN: 0.5000 mg | INTRAMUSCULAR | Status: DC | PRN

## 2017-10-01 MED ORDER — DIPHENHYDRAMINE HCL 12.5 MG/5ML PO ELIX: 12.5000 mg | ORAL_SOLUTION | Freq: Four times a day (QID) | ORAL | Status: DC | PRN

## 2017-10-01 MED ORDER — DEXAMETHASONE SODIUM PHOSPHATE 10 MG/ML IJ SOLN
INTRAMUSCULAR | Status: DC | PRN
  Administered 2017-10-01: 10 mg via INTRAVENOUS

## 2017-10-01 MED ORDER — BUPIVACAINE LIPOSOME 1.3 % IJ SUSP
20.0000 mL | Freq: Once | INTRAMUSCULAR | Status: AC
  Administered 2017-10-01: 20 mL
  Filled 2017-10-01: qty 20

## 2017-10-01 MED ORDER — MEPERIDINE HCL 50 MG/ML IJ SOLN: 6.2500 mg | INTRAMUSCULAR | Status: DC | PRN

## 2017-10-01 MED ORDER — DIPHENHYDRAMINE HCL 50 MG/ML IJ SOLN: 12.5000 mg | Freq: Four times a day (QID) | INTRAMUSCULAR | Status: DC | PRN

## 2017-10-01 MED ORDER — ONDANSETRON HCL 4 MG/2ML IJ SOLN
INTRAMUSCULAR | Status: AC
  Filled 2017-10-01: qty 2

## 2017-10-01 MED ORDER — HYDROMORPHONE HCL 1 MG/ML IJ SOLN
0.2500 mg | INTRAMUSCULAR | Status: DC | PRN
  Administered 2017-10-01 (×4): 0.5 mg via INTRAVENOUS

## 2017-10-01 MED ORDER — SUGAMMADEX SODIUM 200 MG/2ML IV SOLN
INTRAVENOUS | Status: AC
  Filled 2017-10-01: qty 2

## 2017-10-01 MED ORDER — SULFAMETHOXAZOLE-TRIMETHOPRIM 800-160 MG PO TABS: 1.0000 | ORAL_TABLET | Freq: Two times a day (BID) | ORAL | 0 refills | Status: DC

## 2017-10-01 MED ORDER — SODIUM CHLORIDE 0.9 % IV BOLUS (SEPSIS)
1000.0000 mL | Freq: Once | INTRAVENOUS | Status: AC
  Administered 2017-10-01: 1000 mL via INTRAVENOUS

## 2017-10-01 MED ORDER — ONDANSETRON HCL 4 MG/2ML IJ SOLN: 4.0000 mg | INTRAMUSCULAR | Status: DC | PRN

## 2017-10-01 MED ORDER — PHENYLEPHRINE 40 MCG/ML (10ML) SYRINGE FOR IV PUSH (FOR BLOOD PRESSURE SUPPORT)
PREFILLED_SYRINGE | INTRAVENOUS | Status: DC | PRN
  Administered 2017-10-01 (×9): 80 ug via INTRAVENOUS

## 2017-10-01 MED ORDER — ROCURONIUM BROMIDE 10 MG/ML (PF) SYRINGE
PREFILLED_SYRINGE | INTRAVENOUS | Status: DC | PRN
  Administered 2017-10-01: 50 mg via INTRAVENOUS
  Administered 2017-10-01: 20 mg via INTRAVENOUS
  Administered 2017-10-01: 30 mg via INTRAVENOUS
  Administered 2017-10-01 (×2): 10 mg via INTRAVENOUS

## 2017-10-01 MED ORDER — EMPAGLIFLOZIN-LINAGLIPTIN 10-5 MG PO TABS: 1.0000 | ORAL_TABLET | Freq: Every day | ORAL | Status: DC

## 2017-10-01 MED ORDER — SUGAMMADEX SODIUM 500 MG/5ML IV SOLN
INTRAVENOUS | Status: AC
  Filled 2017-10-01: qty 5

## 2017-10-01 MED ORDER — CANAGLIFLOZIN 100 MG PO TABS
100.0000 mg | ORAL_TABLET | Freq: Every day | ORAL | Status: DC
  Administered 2017-10-02: 100 mg via ORAL
  Filled 2017-10-01: qty 1

## 2017-10-01 SURGICAL SUPPLY — 61 items
ADH SKN CLS APL DERMABOND .7 (GAUZE/BANDAGES/DRESSINGS) ×2
APPLICATOR COTTON TIP 6IN STRL (MISCELLANEOUS) ×2 IMPLANT
CATH FOLEY 2WAY SLVR 18FR 30CC (CATHETERS) ×4 IMPLANT
CATH TIEMANN FOLEY 18FR 5CC (CATHETERS) ×4 IMPLANT
CHLORAPREP W/TINT 26ML (MISCELLANEOUS) ×4 IMPLANT
CLIP VESOLOCK LG 6/CT PURPLE (CLIP) ×14 IMPLANT
COVER SURGICAL LIGHT HANDLE (MISCELLANEOUS) ×4 IMPLANT
COVER TIP SHEARS 8 DVNC (MISCELLANEOUS) ×2 IMPLANT
COVER TIP SHEARS 8MM DA VINCI (MISCELLANEOUS) ×2
CUTTER ECHEON FLEX ENDO 45 340 (ENDOMECHANICALS) ×4 IMPLANT
DECANTER SPIKE VIAL GLASS SM (MISCELLANEOUS) ×6 IMPLANT
DERMABOND ADVANCED (GAUZE/BANDAGES/DRESSINGS) ×2
DERMABOND ADVANCED .7 DNX12 (GAUZE/BANDAGES/DRESSINGS) ×2 IMPLANT
DRAPE ARM DVNC X/XI (DISPOSABLE) ×8 IMPLANT
DRAPE COLUMN DVNC XI (DISPOSABLE) ×2 IMPLANT
DRAPE DA VINCI XI ARM (DISPOSABLE) ×8
DRAPE DA VINCI XI COLUMN (DISPOSABLE) ×2
DRAPE SURG IRRIG POUCH 19X23 (DRAPES) ×4 IMPLANT
DRSG TEGADERM 4X4.75 (GAUZE/BANDAGES/DRESSINGS) ×4 IMPLANT
ELECT REM PT RETURN 15FT ADLT (MISCELLANEOUS) ×4 IMPLANT
GAUZE SPONGE 2X2 8PLY STRL LF (GAUZE/BANDAGES/DRESSINGS) IMPLANT
GLOVE BIO SURGEON STRL SZ 6.5 (GLOVE) ×3 IMPLANT
GLOVE BIO SURGEONS STRL SZ 6.5 (GLOVE) ×1
GLOVE BIOGEL M STRL SZ7.5 (GLOVE) ×8 IMPLANT
GLOVE BIOGEL PI IND STRL 6.5 (GLOVE) IMPLANT
GLOVE BIOGEL PI IND STRL 7.0 (GLOVE) IMPLANT
GLOVE BIOGEL PI INDICATOR 6.5 (GLOVE) ×4
GLOVE BIOGEL PI INDICATOR 7.0 (GLOVE) ×12
GOWN STRL REUS W/TWL LRG LVL3 (GOWN DISPOSABLE) ×10 IMPLANT
GOWN STRL REUS W/TWL XL LVL3 (GOWN DISPOSABLE) ×8 IMPLANT
HEMOSTAT SURGICEL 2X3 (HEMOSTASIS) ×2 IMPLANT
HOLDER FOLEY CATH W/STRAP (MISCELLANEOUS) ×4 IMPLANT
IRRIG SUCT STRYKERFLOW 2 WTIP (MISCELLANEOUS) ×4
IRRIGATION SUCT STRKRFLW 2 WTP (MISCELLANEOUS) ×2 IMPLANT
NDL INSUFFLATION 14GA 120MM (NEEDLE) IMPLANT
NEEDLE INSUFFLATION 14GA 120MM (NEEDLE) ×4 IMPLANT
PACK ROBOT UROLOGY CUSTOM (CUSTOM PROCEDURE TRAY) ×4 IMPLANT
PAD POSITIONING PINK XL (MISCELLANEOUS) ×2 IMPLANT
PENCIL HANDSWITCHING (ELECTRODE) ×2 IMPLANT
PORT ACCESS TROCAR AIRSEAL 12 (TROCAR) IMPLANT
PORT ACCESS TROCAR AIRSEAL 5M (TROCAR) ×2
RELOAD STAPLE 45 4.1 GRN THCK (STAPLE) ×2 IMPLANT
SEAL CANN UNIV 5-8 DVNC XI (MISCELLANEOUS) ×8 IMPLANT
SEAL XI 5MM-8MM UNIVERSAL (MISCELLANEOUS) ×8
SET TRI-LUMEN FLTR TB AIRSEAL (TUBING) ×2 IMPLANT
SOLUTION ELECTROLUBE (MISCELLANEOUS) ×4 IMPLANT
SPONGE GAUZE 2X2 STER 10/PKG (GAUZE/BANDAGES/DRESSINGS)
STAPLE RELOAD 45 GRN (STAPLE) ×2 IMPLANT
STAPLE RELOAD 45MM GREEN (STAPLE) ×4
SUT ETHILON 3 0 PS 1 (SUTURE) ×4 IMPLANT
SUT MNCRL AB 4-0 PS2 18 (SUTURE) ×8 IMPLANT
SUT V-LOC BARB 180 2/0GR6 GS22 (SUTURE) ×4
SUT VIC AB 0 CT1 27 (SUTURE) ×4
SUT VIC AB 0 CT1 27XBRD ANTBC (SUTURE) ×2 IMPLANT
SUT VIC AB 2-0 SH 27 (SUTURE) ×4
SUT VIC AB 2-0 SH 27X BRD (SUTURE) ×2 IMPLANT
SUT VICRYL 0 UR6 27IN ABS (SUTURE) ×8 IMPLANT
SUT VLOC BARB 180 ABS3/0GR12 (SUTURE) ×8
SUTURE V-LC BRB 180 2/0GR6GS22 (SUTURE) ×2 IMPLANT
SUTURE VLOC BRB 180 ABS3/0GR12 (SUTURE) ×4 IMPLANT
TOWEL OR NON WOVEN STRL DISP B (DISPOSABLE) ×4 IMPLANT

## 2017-10-01 NOTE — Anesthesia Preprocedure Evaluation (Signed)
Anesthesia Evaluation  Patient identified by MRN, date of birth, ID band Patient awake    Reviewed: Allergy & Precautions, NPO status , Patient's Chart, lab work & pertinent test results  Airway Mallampati: I  TM Distance: >3 FB Neck ROM: Full    Dental   Pulmonary    Pulmonary exam normal        Cardiovascular hypertension, Pt. on medications Normal cardiovascular exam     Neuro/Psych    GI/Hepatic   Endo/Other  diabetes, Type 2, Oral Hypoglycemic Agents  Renal/GU      Musculoskeletal   Abdominal   Peds  Hematology   Anesthesia Other Findings   Reproductive/Obstetrics                             Anesthesia Physical Anesthesia Plan  ASA: II  Anesthesia Plan: General   Post-op Pain Management:    Induction: Intravenous  PONV Risk Score and Plan: 2 and Ondansetron, Dexamethasone and Midazolam  Airway Management Planned: Oral ETT  Additional Equipment:   Intra-op Plan:   Post-operative Plan: Extubation in OR  Informed Consent: I have reviewed the patients History and Physical, chart, labs and discussed the procedure including the risks, benefits and alternatives for the proposed anesthesia with the patient or authorized representative who has indicated his/her understanding and acceptance.     Plan Discussed with: CRNA and Surgeon  Anesthesia Plan Comments:         Anesthesia Quick Evaluation

## 2017-10-01 NOTE — Op Note (Signed)
Preoperative diagnosis:  1. Prostate Cancer   Postoperative diagnosis:  1. same   Procedure: 1. Robotic assisted laparoscopic radical prostatectomy 2. Bilateral pelvic lymph node dissection  Surgeon: Ardis Hughs, MD First Assistant: Debbrah Alar, PA Resident Asisstant: Jonna Clark, MD  Anesthesia: General  Complications: None  Intraoperative findings: No evidence of grossly extraprostatic disease. Prostate, seminal vesicles removed en bloc. Nerve sparing not performed. Post anastamosis irrigation of bladder demonstrated water tight repair.   EBL: Minimal  Specimens:  #1.  Prostate and seminal vesicals #2.  Bilateral pelvic lymph nodes  Indication: Stephen Craig is a 57 yo patient with prostate cancer.  After reviewing the management options for treatment, he elected to proceed with the removal of his prostate. We have discussed the potential benefits and risks of the procedure, side effects of the proposed treatment, the likelihood of the patient achieving the goals of the procedure, and any potential problems that might occur during the procedure or recuperation. Informed consent has been obtained.  Description of procedure: An assistant was required for this surgical procedure.  The duties of the assistant included but were not limited to suctioning, passing suture, camera manipulation, retraction. This procedure would not be able to be performed without an Environmental consultant.  The patient was consented in the preoperative holding area. He was in brought back to the operating room placed the table in supine position. General anesthesia was then induced and endotracheal tube was inserted. He was then placed in dorsolithotomy position and placed in steep Trendelenburg. He was then prepped and draped in the routine sterile fashion.   We, the first assistant and I, then began by making a 8 mm incision supraumbilical midline incision through the skin. A Veress needle was used to  obtain pneumoperitoneum of 80mmHg. An 8 mm robotic trocar was placed blindly into the abdomen and the 0 robotic lens was inserted. Visualization of abdomen demonstrated no injury from port or needle placement, and no presence of adhesions. We then placed 2 additional 8 millimeter trochars in the patient's left lower abdomen approximatly 9 cm apart and 2 trochars on the patient's right lower abdomen, one was an 8 mm trocar and the one most lateral was a 12 mm trocar which was used as the assistant port. A 5 mm trocar was placed by triangulating the 2 right lateral ports as a second assistant port in the right upper quadrant. These ports were all placed under visual guidance. Once the ports were noted to be satisfactory position the robot was docked. We started with the 0 lens, monopolar scissors in the right hand and the [place] forceps the left hand as well as a fenestrated grasper as the third arm on the left-hand side.   We, the first assistant and I,  began our dissection the posterior plane incising the peritoneum at the level of the vas deferens. Isolated the left vas deferens and dissected it proximally towards the spermatic cord for 5 cm prior to ligating it. Then used this as traction to isolate the left the seminal vesicle which was then undressed bluntly and completely dissected out, all vessels were cauterized with a combination of bipolar and the monopolar scissors. We then turned our attention to the right side and similarly dissected out the right vas deferens and seminal vesicle. Once the SVs had been freed, we turned our attention to the posterior plane and bluntly dissected the tissue between the rectum and the posterior wall of the prostate bluntly out towards the apex.  At this point the bladder was taken down starting at the urachal remnant with a combination of both blunt dissection and sharp dissection using monopolar cautery the bladder was dropped down in the usual fashion to the  medial umbilical ligaments laterally and the dorsal vein of the prostate anteriorly creating our space of Retzius. We then turned our attention to the endopelvic fascia which was incised laterally starting on the patient's right-hand side the levator muscles were pushed off the prostate laterally up towards the dorsal vein complex on the right-hand side. This process was then repeated on the left-hand side and a nice notch was created for the dorsal vein. I then used a 50mm stapler to staple the dorsal vein.   We,the first assistant and I, then located the bladder neck at the vesicoprostatic junction and using the monopolar scissors dissected down through the perivesical tissues and the bladder neck down to the prostatic urethra. The catheter was then deflated and pulled through our urethral opening and then used to retract the prostate anteriorly for the posterior bladder neck dissection. Once through the bladder neck and into the posterior plane of the prostate, the SVs were brought through the opening. The left pedicle was then isolated and systematically ligated with Weck clips and scissors. This was then repeated on the right side.    I then came down through the dorsal venous complex anteriorly down to the membranous urethra using the monopolar. Once down to the urethra, it was transected sharply and the apex of the prostate was then dissected off the levator and rectourethralis muscles. Once the apex of the prostate had been dissected free we came back to the base of the prostate and bluntly push the rectum and nerve vascular bundle off the prostate the patient's left and used clips on the patient's right to free the prostate. Once the prostate was free it was placed off to the side. The pelvis was then irrigated with normal saline and noted to be relatively hemostatic.  Attention was then turned to the right pelvic sidewall. The fibrofatty tissue between the external iliac vein, confluence of the iliac  vessels, hypogastric artery, and Cooper's ligament was dissected free from the pelvic sidewall with care to preserve the obturator nerve. Weck clips were used for lymphostasis and hemostasis. An identical procedure was performed on the contralateral side and the lymphatic packets were removed for permanent pathologic analysis. Clips were placed on the left sided lymph nodes to identify them during pathologic analysis.   The prostate and both lymph node tissues were placed in the Endo Catch bag and the string brought to the 5 mm port.    The vesicourethral anastomosis was then completed with 2 interlocking 3-0 V. lock sutures running the anastomosis in the 6:00 position to the 12:00 position on each side and then tying it off on the top. The final catheter was then passed through the patient's urethra and into the bladder and 120 cc was instilled into the bladder to test the anastomosis. As there was no leak a 60 [place] Blake drain was passed through the left lateral port and placed around the vesicourethral anastomosis. A 12 mm assistant port on the right lateral side was then closed with 0 Vicryl with the help of the [employer] needle. The 12 mm midline infraumbilical incision was then extended another centimeter taken down and the fascia opened to remove the Endo Catch bag with the prostate specimen. The fascia was then closed with a 0 Vicryl and  all skin ports were closed with 4-0 Monocryl in a subcutaneous fashion. Dermabond glue was then applied to the incisions. The drain was then secured to the skin with a 0 nylon stitch and dressing applied.   At the end of the case all laps needles and sponges had been accounted for. There no immediate complications. The patient returned to the PACU in stable condition.

## 2017-10-01 NOTE — Care Management Note (Signed)
Case Management Note  Patient Details  Name: Stephen Craig MRN: [id number] Date of Birth: [birth year]  Subjective/Objective: 57 y/o m admitted w/Prostate Ca. S/p Lap rad prostatectomy. From home.                   Action/Plan:d/c home.   Expected Discharge Date:                  Expected Discharge Plan:  Home/Self Care  In-House Referral:     Discharge planning Services     Post Acute Care Choice:    Choice offered to:     DME Arranged:    DME Agency:     HH Arranged:    [place] Agency:     Status of Service:  Completed, signed off  If discussed at [employer] of Stay Meetings, dates discussed:    Additional Comments:  Dessa Phi, RN 10/01/2017, 3:04 PM

## 2017-10-01 NOTE — Anesthesia Procedure Notes (Signed)
Procedure Name: Intubation Performed by: Dannell Gortney J, CRNA Pre-anesthesia Checklist: Patient identified, Emergency Drugs available, Suction available, Patient being monitored and Timeout performed Patient Re-evaluated:Patient Re-evaluated prior to induction Oxygen Delivery Method: Circle system utilized Preoxygenation: Pre-oxygenation with 100% oxygen Induction Type: IV induction Ventilation: Mask ventilation without difficulty Laryngoscope Size: Mac and 4 Grade View: Grade I Tube type: Oral Tube size: 7.5 mm Number of attempts: 1 Airway Equipment and Method: Stylet Placement Confirmation: ETT inserted through vocal cords under direct vision,  positive ETCO2,  CO2 detector and breath sounds checked- equal and bilateral Secured at: 23 cm Tube secured with: Tape Dental Injury: Teeth and Oropharynx as per pre-operative assessment        

## 2017-10-01 NOTE — H&P (Signed)
Elevated PSA- Established Patient  HPI: Stephen Craig is a 57 year-old male established patient who is here for follow up for further evaluation of his elevated PSA.  The patient was last seen Jan 2018. His last PSA was performed approximately 03/29/2017. The last PSA value was 4.88 (21% free). His previous PSA(s) are: 1/18: 4.65, 12/17: 5.9, 6/17 4.38, 8/16 3.7. The patient states he does not take 5 alpha reductase inhibitor medication.   The patient states they currently do not have any significant urinary tract symptoms.   He has not had a prostate biopsy done. The patient does not have a family history of prostate cancer.   Intv: Seen today for discussion of new diagnosis of prostate cancer.  T1c, PSA: 4.88, Gleason 3+4=7 (3/12 cores)  TRUS: 22gm   History of blunt trauma with prolonged hospitalization and a feeding tube. Excerices daily. No abdominal surgery. H/o right inguinal hernia repair.   Works for [employer].       AUA Symptom Score: He never has the sensation of not emptying his bladder completely after finishing urinating. He never has to urinate again less that two hours after he has finished urinating. He does not have to stop and start again several times when he urinates. He never finds it difficult to postpone urination. He never has a weak urinary stream. He never has to push or strain to begin urination. He never has to get up to urinate from the time he goes to bed until the time he gets up in the morning.   Calculated AUA Symptom Score: 0    IIEF-5 Score: The patient's confidence that he can get an erection is moderate. The patient's erections were hard enough for penetration a few times. The patient was able to maintain his erection after he had penetrated his partner a few times. During sexual intercouse, it was slightly difficult to maintain his erection to the completion of intercourse. The patient found sexual intercourse satisfactory most times.   Calculated IIEF-5  Symptom Score: 15    QOL Score: He would feel mostly satisfied if he had to live with his urinary condition the way it is now for the rest of his life.   Calculated QOL Symptom Score: 2    ALLERGIES: None   MEDICATIONS: Lisinopril 20 mg tablet  Metformin Hcl  Pravastatin Sodium     GU PSH: Prostate Needle Biopsy - 06/18/2017      PSH Notes: jaw surgery (broken jaw)-2009   NON-GU PSH: Surgical Pathology, Gross And Microscopic Examination For Prostate Needle - 06/18/2017    GU PMH: ED due to arterial insufficiency - 04/05/2017 Elevated PSA - 04/05/2017, The patient's PSA is elevated and has been trending upward. He has had recent urinary frequency, but this is been persistent for quite some time., - 10/08/2016    NON-GU PMH: Diabetes Type 2    FAMILY HISTORY: 1 son - Son   SOCIAL HISTORY: Marital Status: Single Preferred Language: English; Ethnicity: Not Hispanic Or Latino; Race: Black or African American Current Smoking Status: Patient has never smoked.   Tobacco Use Assessment Completed: Used Tobacco in last 30 days? Has never drank.  Does not drink caffeine. Patient's occupation is/was [employer].    REVIEW OF SYSTEMS:    GU Review Male:   Patient denies frequent urination, hard to postpone urination, burning/ pain with urination, get up at night to urinate, leakage of urine, stream starts and stops, trouble starting your stream, have to strain to urinate ,  erection problems, and penile pain.  Gastrointestinal (Upper):   Patient denies nausea, vomiting, and indigestion/ heartburn.  Gastrointestinal (Lower):   Patient denies diarrhea and constipation.  Constitutional:   Patient denies fever, night sweats, weight loss, and fatigue.  Skin:   Patient reports itching. Patient denies skin rash/ lesion.  Eyes:   Patient denies blurred vision and double vision.  Ears/ Nose/ Throat:   Patient denies sore throat and sinus problems.  Hematologic/Lymphatic:   Patient denies swollen  glands and easy bruising.  Cardiovascular:   Patient denies leg swelling and chest pains.  Respiratory:   Patient denies cough and shortness of breath.  Endocrine:   Patient denies excessive thirst.  Musculoskeletal:   Patient denies back pain and joint pain.  Neurological:   Patient denies headaches and dizziness.  Psychologic:   Patient denies depression and anxiety.   VITAL SIGNS: None   MULTI-SYSTEM PHYSICAL EXAMINATION:    Constitutional: Well-nourished. No physical deformities. Normally developed. Good grooming.  Neck: Neck symmetrical, not swollen. Normal tracheal position.  Respiratory: No labored breathing, no use of accessory muscles. CTA-B  Cardiovascular: Normal temperature, normal extremity pulses, no swelling, no varicosities. RRR  Lymphatic: No enlargement of neck, axillae, groin.  Skin: No paleness, no jaundice, no cyanosis. No lesion, no ulcer, no rash.  Neurologic / Psychiatric: Oriented to time, oriented to place, oriented to person. No depression, no anxiety, no agitation.  Gastrointestinal: Soft/ Non-tender  Eyes: Normal conjunctivae. Normal eyelids.  Ears, Nose, Mouth, and Throat: Left ear no scars, no lesions, no masses. Right ear no scars, no lesions, no masses. Nose no scars, no lesions, no masses. Normal hearing. Normal lips.  Musculoskeletal: Normal gait and station of head and neck.     PAST DATA REVIEWED:  Source Of History:  Patient  Lab Test Review:   PSA  Records Review:   Pathology Reports, Previous Doctor Records, Previous Patient Records, POC Tool   03/29/17 10/08/16  PSA  Total PSA 4.88 ng/mL 4.65 ng/dl  Free PSA 1.01 ng/mL 1.12 ng/dl  % Free PSA 21 % PSA 24 %    PROCEDURES:          Urinalysis w/Scope Dipstick Dipstick Cont'd Micro  Color: Yellow Bilirubin: Neg WBC/hpf: NS (Not Seen)  Appearance: Clear Ketones: Trace RBC/hpf: 0 - 2/hpf  Specific Gravity: 1.020 Blood: Neg Bacteria: NS (Not Seen)  pH: 5.5 Protein: 2+ Cystals: NS (Not Seen)   Glucose: Neg Urobilinogen: 0.2 Casts: Hyaline    Nitrites: Neg Trichomonas: Not Present    Leukocyte Esterase: Neg Mucous: Not Present      Epithelial Cells: 0 - 5/hpf      Yeast: NS (Not Seen)      Sperm: Not Present    ASSESSMENT:      ICD-10 Details  1 GU:   Prostate Cancer - C61    PLAN:           Schedule Return Visit/Planned Activity: ASAP - Schedule Surgery          Document Letter(s):  Created for Patient: Clinical Summary    We discussed prostatectomy and specifically robotic prostatectomy with bilateral pelvic lymphadenectomy. I showed the patient on their abdomen the approximately 6 small incision (trocar) sites as well as presumed extraction sites with robotic approach as well as possible open incision sites should open conversion be necessary. We discussed peri-operative risks including bleeding, infection, deep vein thrombosis, pulmonary embolism, compartment syndrome, neuropathy / neuropraxia, heart attack, stroke, death, as well as long-term  risks such as non-cure / need for additional therapy. We specifically addressed that the procedure would compromise urinary control leading to stress incontinence which typically resolves with time and pelvic rehabilitation (Kegel's, etc..), but can sometimes be permanent and require additional therapy including surgery. We also specifically addressed sexual side-effects including significant erectile dysfunction which typically partially resolves with time but can also be permanent and require additional therapy including surgery.   We discussed the typical hospital course including usual 1-2 night hospitalization, discharge with foley catheter in place usually for 1-2 weeks before voiding trial as well as usually 2 week recovery until able to perform most non-strenuous activity and 6 weeks until able to return to most jobs and more strenuous activity such as exercise.   The patient was counseled about the natural history of prostate  cancer and the standard treatment options that are available for prostate cancer. It was explained to him how his age and life expectancy, clinical stage, Gleason score, and PSA affect his prognosis, the decision to proceed with additional staging studies, as well as how that information influences recommended treatment strategies. We discussed the roles for active surveillance, radiation therapy, surgical therapy, androgen deprivation, as well as ablative therapy options for the treatment of prostate cancer as appropriate to his individual cancer situation. We discussed the risks and benefits of these options with regard to their impact on cancer control and also in terms of potential adverse events, complications, and impact on quality of life particularly related to urinary, bowel, and sexual function. The patient was encouraged to ask questions throughout the discussion today and all questions were answered to his stated satisfaction. In addition, the patient was provided with and/or directed to appropriate resources and literature for further education about prostate cancer and treatment options.   35 minutes were spent in face to face consultation with patient today.          Notes:   The patient has low risk intermediate volume disease. He is very young and as such, is best served in my opinion with surgery. However we did have a complete discussion about prostate cancer and all his treatment options. Ultimately, the patient has decided to proceed with robotic-assisted laparoscopic prostatectomy.

## 2017-10-01 NOTE — Progress Notes (Signed)
Post-op note  Subjective: The patient is doing well.  No complaints.  Objective: Vital signs in last 24 hours: Temp:  [97.8 F (36.6 C)-98.2 F (36.8 C)] 97.8 F (36.6 C) (01/18 1302) Pulse Rate:  [74-99] 99 (01/18 1302) Resp:  [9-18] 16 (01/18 1302) BP: (144-168)/(95-112) 157/96 (01/18 1302) SpO2:  [97 %-100 %] 100 % (01/18 1302) Weight:  [74.8 kg (165 lb)] 74.8 kg (165 lb) (01/18 0622)  Intake/Output from previous day: No intake/output data recorded. Intake/Output this shift: Total I/O In: 2000 [I.V.:2000] Out: 910 [Urine:500; Drains:60; Blood:350]  Physical Exam:  General: Alert and oriented. Abdomen: Soft, appropriately distended and tender  Incisions: Clean and dry. JP drain with minimal serosanguinous fluid in bulb GU: foley catheter to drainage, draining pink tinged urine   Lab Results: Recent Labs    10/01/17 1148  HGB 12.8*  HCT 39.3    Assessment/Plan: POD#0 s/p RALP, bilateral pelvic lymph node dissection   1) Continue to monitor   Jonna Clark, MD   LOS: 0 days   Iris Hairston L 10/01/2017, 1:14 PM

## 2017-10-01 NOTE — Discharge Instructions (Signed)

## 2017-10-01 NOTE — Transfer of Care (Signed)
Immediate Anesthesia Transfer of Care Note  Patient: Stephen Craig  Procedure(s) Performed: XI ROBOTIC ASSISTED LAPAROSCOPIC RADICAL PROSTATECTOMY (N/A ) LYMPHADENECTOMY (Bilateral )  Patient Location: PACU  Anesthesia Type:General  Level of Consciousness: awake, alert  and oriented  Airway & Oxygen Therapy: Patient Spontanous Breathing and Patient connected to face mask oxygen  Post-op Assessment: Report given to RN and Post -op Vital signs reviewed and stable  Post vital signs: Reviewed and stable  Last Vitals:  Vitals:   10/01/17 0557  BP: (!) 144/96  Pulse: 74  Resp: 18  SpO2: 97%    Last Pain:  Vitals:   10/01/17 0557  TempSrc: Oral         Complications: No apparent anesthesia complications

## 2017-10-01 NOTE — Anesthesia Postprocedure Evaluation (Signed)
Anesthesia Post Note  Patient: Stephen Craig  Procedure(s) Performed: XI ROBOTIC ASSISTED LAPAROSCOPIC RADICAL PROSTATECTOMY (N/A ) LYMPHADENECTOMY (Bilateral )     Patient location during evaluation: PACU Anesthesia Type: General Level of consciousness: awake Pain management: pain level controlled Vital Signs Assessment: post-procedure vital signs reviewed and stable Respiratory status: spontaneous breathing Cardiovascular status: stable Anesthetic complications: no    Last Vitals:  Vitals:   10/01/17 0557 10/01/17 1138  BP: (!) 144/96 (!) 159/95  Pulse: 74   Resp: 18   Temp:  36.8 C  SpO2: 97%     Last Pain:  Vitals:   10/01/17 1138  TempSrc:   PainSc: 8                  Rosielee Corporan

## 2017-10-02 DIAGNOSIS — C61 Malignant neoplasm of prostate: Secondary | ICD-10-CM | POA: Diagnosis not present

## 2017-10-02 LAB — BASIC METABOLIC PANEL
Anion gap: 5 (ref 5–15)
BUN: 21 mg/dL — AB (ref 6–20)
CO2: 24 mmol/L (ref 22–32)
CREATININE: 1.26 mg/dL — AB (ref 0.61–1.24)
Calcium: 8.2 mg/dL — ABNORMAL LOW (ref 8.9–10.3)
Chloride: 107 mmol/L (ref 101–111)
GFR calc Af Amer: >60 mL/min (ref >60)
GFR calc non Af Amer: >60 mL/min (ref >60)
Glucose, Bld: 108 mg/dL — ABNORMAL HIGH (ref 65–99)
Potassium: 3.9 mmol/L (ref 3.5–5.1)
Sodium: 136 mmol/L (ref 135–145)

## 2017-10-02 LAB — GLUCOSE, CAPILLARY
GLUCOSE-CAPILLARY: 111 mg/dL — AB (ref 65–99)
Glucose-Capillary: 104 mg/dL — ABNORMAL HIGH (ref 65–99)
Glucose-Capillary: 108 mg/dL — ABNORMAL HIGH (ref 65–99)

## 2017-10-02 LAB — HEMOGLOBIN AND HEMATOCRIT, BLOOD
HCT: 34.8 % — ABNORMAL LOW (ref 39.0–52.0)
Hemoglobin: 11.6 g/dL — ABNORMAL LOW (ref 13.0–17.0)

## 2017-10-02 NOTE — Progress Notes (Signed)
1 Day Post-Op Subjective: Patient reports he is doing well. Crampy gas pain. Walked the halls. On clears.   Objective: Vital signs in last 24 hours: Temp:  [97.7 F (36.5 C)-99.5 F (37.5 C)] 99 F (37.2 C) (01/19 0601) Pulse Rate:  [72-99] 72 (01/19 0601) Resp:  [14-17] 17 (01/19 0601) BP: (122-161)/(71-105) 122/76 (01/19 0913) SpO2:  [99 %-100 %] 99 % (01/19 0601) Weight:  [74.5 kg (164 lb 3.9 oz)] 74.5 kg (164 lb 3.9 oz) (01/18 1302)  Intake/Output from previous day: 01/18 0701 - 01/19 0700 In: 3730 [I.V.:3430; IV Piggyback:300] Out: 3165 [Urine:2600; Drains:215; Blood:350] Intake/Output this shift: Total I/O In: 271.7 [I.V.:271.7] Out: 160 [Urine:150; Drains:10]  Physical Exam:  NAD Abd - soft, NT, good BS Urine clear, foley in place   Lab Results: Recent Labs    10/01/17 1148 10/02/17 0444  HGB 12.8* 11.6*  HCT 39.3 34.8*   BMET Recent Labs    10/02/17 0444  NA 136  K 3.9  CL 107  CO2 24  GLUCOSE 108*  BUN 21*  CREATININE 1.26*  CALCIUM 8.2*   No results for input(s): LABPT, INR in the last 72 hours. No results for input(s): LABURIN in the last 72 hours. No results found for this or any previous visit.  Studies/Results: No results found.  Assessment/Plan:  -regular diet  SLIV  Home later if doing well    LOS: 0 days   Festus Aloe 10/02/2017, 12:21 PM

## 2017-10-02 NOTE — Discharge Summary (Signed)
Physician Discharge Summary  Patient ID: Stephen Craig MRN: [id number] DOB/AGE: March 28, 1961 57 y.o.  Admit date: 10/01/2017 Discharge date: 10/02/2017  Admission Diagnoses:  Discharge Diagnoses:  Active Problems:   Prostate cancer [facility])   Discharged Condition: good  Hospital Course: Pt admitted after RALP. On POD#1 he was tolerating a regular diet, ambulating and had minimal pain. His JP drain had minimal outpt and was removed. He was discharged to home.   Consults: None  Significant Diagnostic Studies: none  Treatments: surgery: Procedure: 1. Robotic assisted laparoscopic radical prostatectomy 2. Bilateral pelvic lymph node dissection   Discharge Exam: Blood pressure 122/76, pulse 72, temperature 99 F (37.2 C), temperature source Oral, resp. rate 17, height 5\' 7"  (1.702 m), weight 74.5 kg (164 lb 3.9 oz), SpO2 99 %. See progress note - Pt appeared well  NAD Ext - no CCE Resp - regular effort and depth Urine clear, foley in place   Disposition: 01-Home or Self Care   Allergies as of 10/02/2017   No Known Allergies     Medication List    STOP taking these medications   HYDROcodone-acetaminophen 5-500 MG tablet Commonly known as:  VICODIN   naproxen 375 MG tablet Commonly known as:  NAPROSYN     TAKE these medications   GLYXAMBI 10-5 MG Tabs Generic drug:  Empagliflozin-Linagliptin Take 1 tablet by mouth daily.   lisinopril 20 MG tablet Commonly known as:  PRINIVIL,ZESTRIL Take 20 mg by mouth daily.   metFORMIN 500 MG tablet Commonly known as:  GLUCOPHAGE Take 1,000 mg by mouth 2 (two) times daily with a meal.   pravastatin 40 MG tablet Commonly known as:  PRAVACHOL Take 80 mg by mouth daily.   sulfamethoxazole-trimethoprim 800-160 MG tablet Commonly known as:  BACTRIM DS,SEPTRA DS Take 1 tablet by mouth 2 (two) times daily. Start the day prior to foley removal appointment   traMADol 50 MG tablet Commonly known as:  ULTRAM Take 1-2 tablets  (50-100 mg total) by mouth every 6 (six) hours as needed for moderate pain or severe pain.      Follow-up Information    Ardis Hughs, MD Follow up.   Specialty:  Urology Why:  office will call you with date and time of appt.  Contact information: [place] [address] [phone]           Signed: Festus Aloe 10/02/2017, 5:46 PM

## 2017-10-02 NOTE — Progress Notes (Signed)
Patient discharged home with son, discharge instructions/prescription, foley care given and explained to patient/son, and they verbalized understanding, patient denies any pain/distress, surgical incision clean/dry/intact, no sign of infection noted. accompanied home by son, transported to the car by staff via wheelchair.

## 2018-06-18 IMAGING — CR DG LUMBAR SPINE 2-3V
3 series · 3 of 3 positions shown · non-contrast
Comparison: Coronal and sagittal reconstructed images from an
abdominal and pelvic CT scan February 01, 2008

CLINICAL DATA: Low back pain for the past 5 days. Pain is worse
when bending. No history of injury.

EXAM:
LUMBAR SPINE - 2-3 VIEW

[t l-spine a.p.]
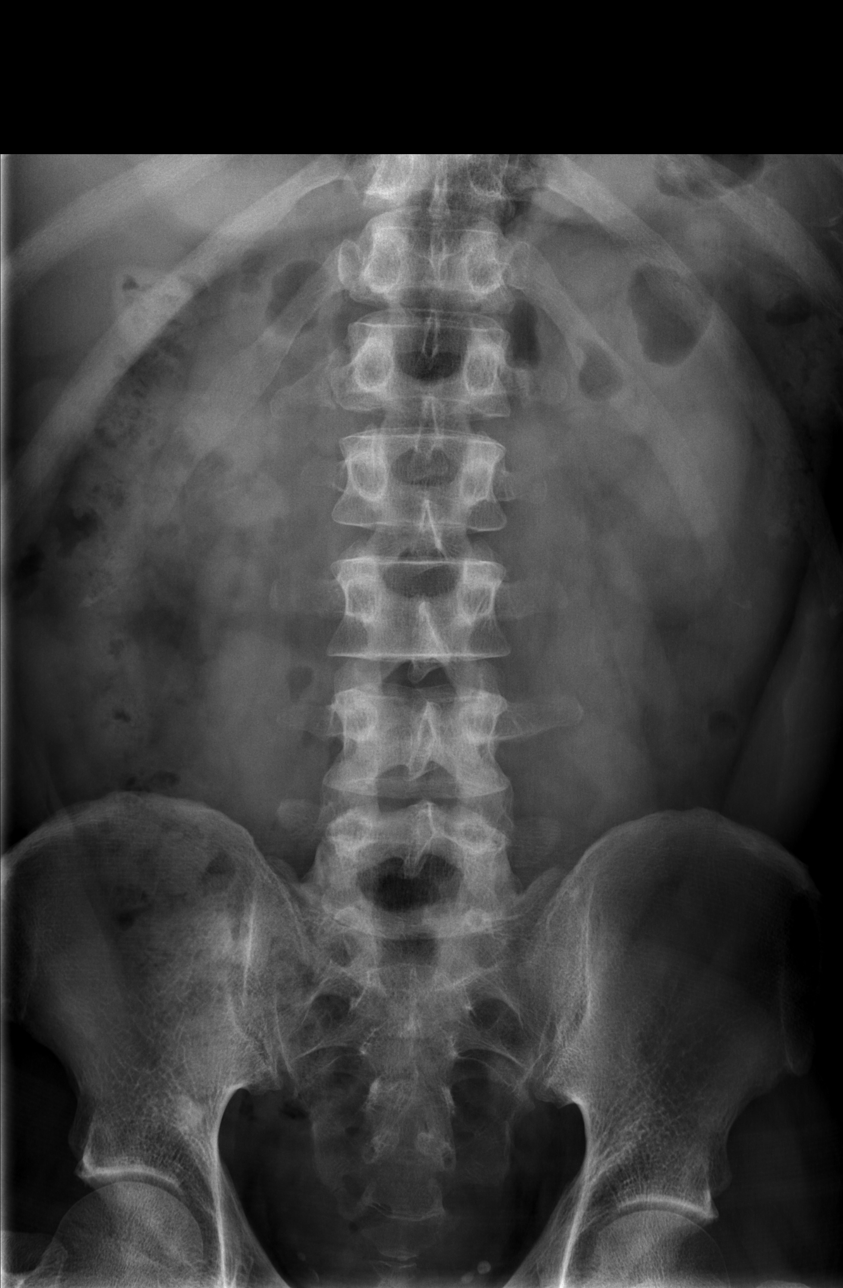

[t l-spine lat]
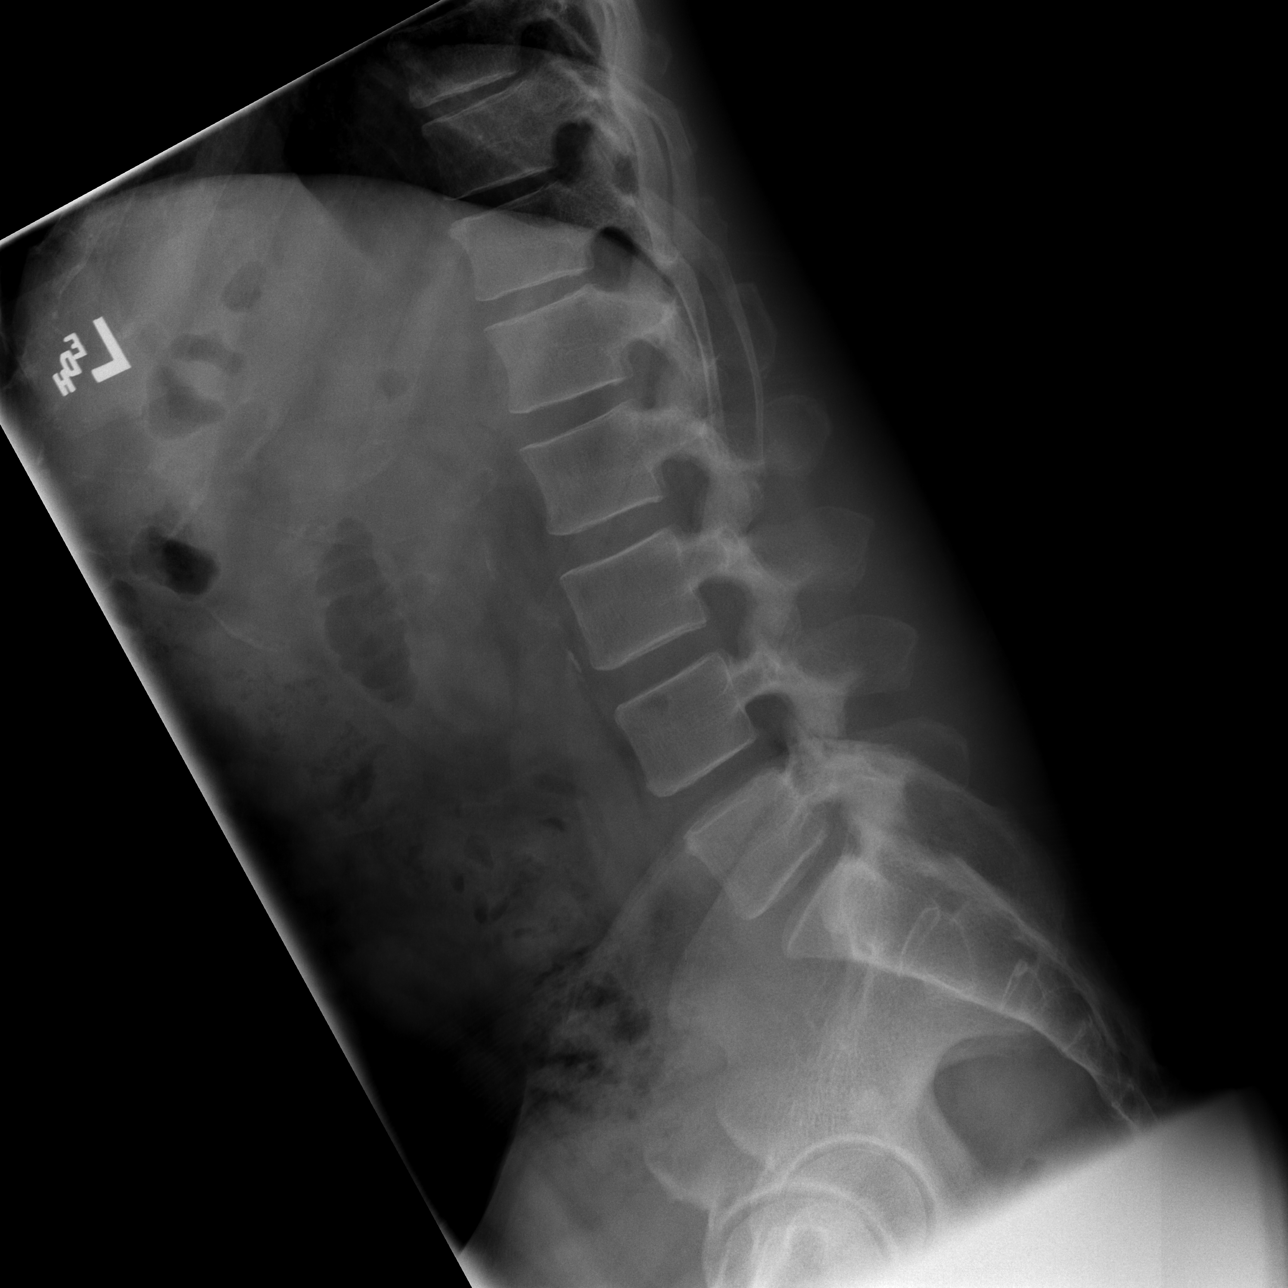

[t l-spine l5-s1 spot]
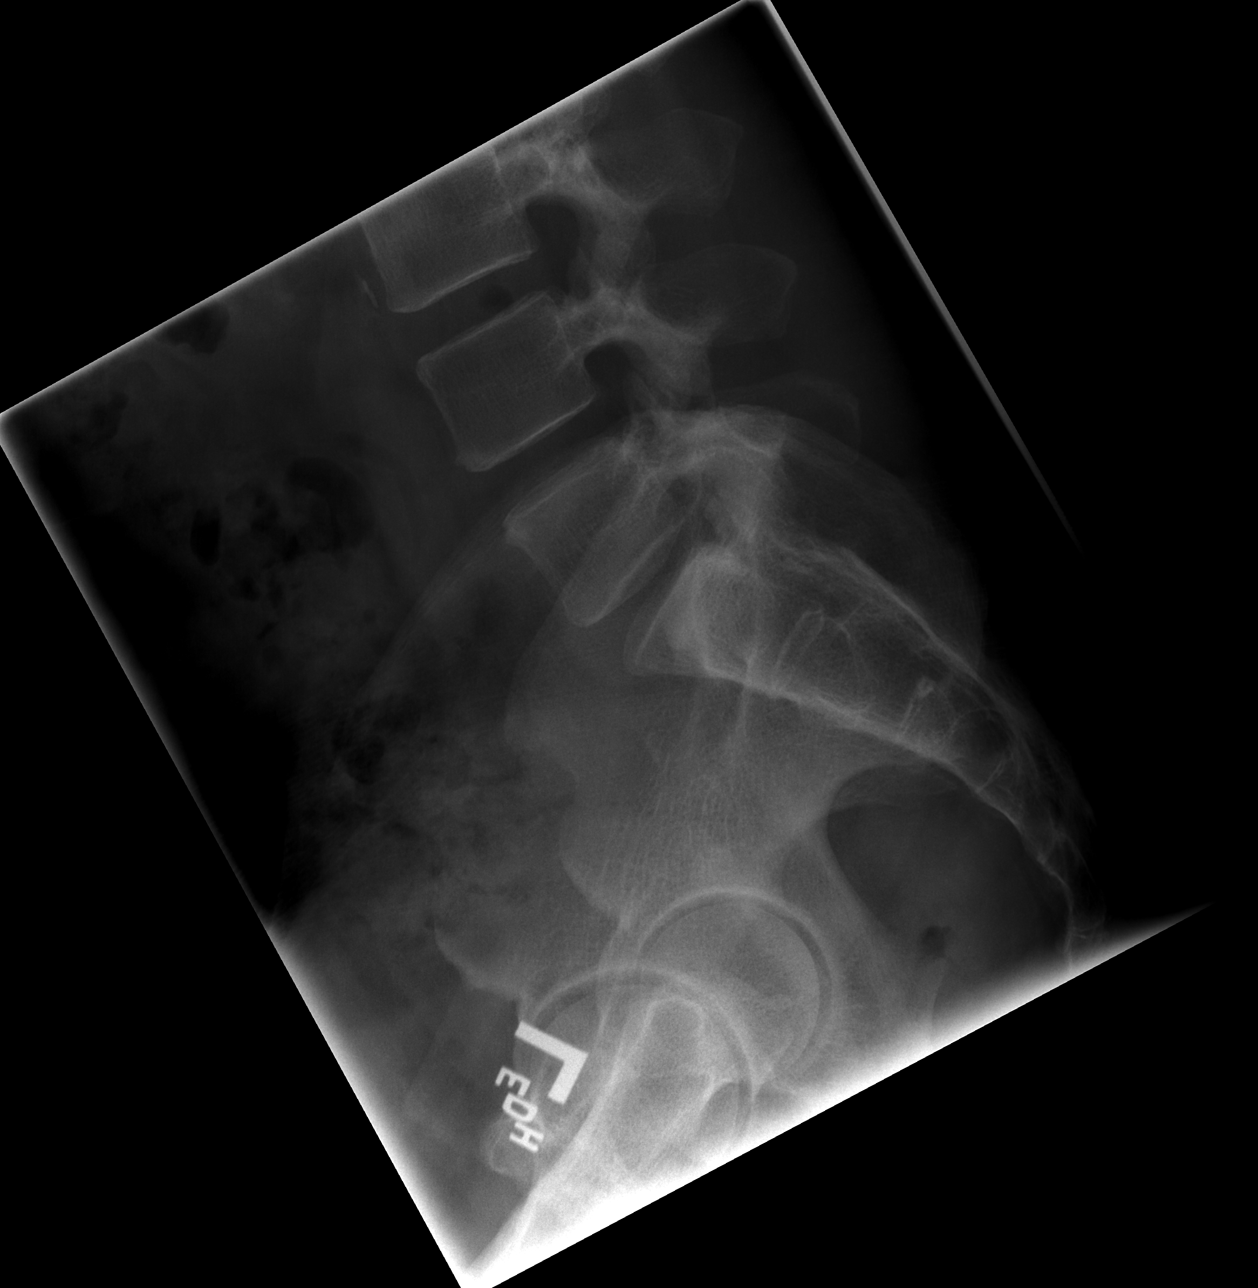

[3 of 3 positions shown; findings below may reference images not displayed]

FINDINGS: The lumbar vertebral bodies are preserved in height. The pedicles
and transverse processes are intact. The disc space heights are well
maintained. There is no spondylolisthesis. There is no significant
facet joint hypertrophy. There is calcification in the wall of the
abdominal aorta. The observed portions of the sacrum are normal.
IMPRESSION: There is no acute or significant chronic bony abnormality of the
lumbar spine.

Abdominal aortic atherosclerosis.

## 2018-10-24 ENCOUNTER — Ambulatory Visit: Payer: Self-pay | Admitting: Surgery

## 2018-10-24 NOTE — H&P (Signed)
History of Present Illness Stephen Craig. Sargent Mankey MD; 10/24/2018 10:36 AM) The patient is a 58 year old male who presents with an inguinal hernia. Referred by Dr. Donnie Coffin for left inguinal hernia  This is a 58 year old male who is status post right inguinal hernia repair with mesh in 2009 for a large direct hernia. In 2019, he underwent robotic prostatectomy for prostate cancer. He continues to have a small amount of urinary dribbling but otherwise has fully recovered. About 6 months ago, he noticed a bulge in his left groin. This has become larger and is causing some discomfort. He denies any change in his bowel movements. The bulge remains reducible. He presents now to discuss left inguinal hernia repair.  The patient is on multiple medications for type 2 diabetes as well as hypertension and hyperlipidemia.   Diagnostic Studies History (Tanisha A. Owens Shark, [place]; 10/24/2018 10:11 AM) Colonoscopy never  Allergies (Tanisha A. Owens Shark, [place]; 10/24/2018 10:13 AM) No Known Drug Allergies [10/24/2018]: Allergies Reconciled  Medication History (Tanisha A. Owens Shark, [place]; 10/24/2018 10:14 AM) Judye Bos (25-5MG  Tablet, Oral) Active. Lisinopril (20MG  Tablet, Oral) Active. metFORMIN HCl (1000MG  Tablet, Oral) Active. Pravastatin Sodium (40MG  Tablet, Oral) Active. Repaglinide (0.5MG  Tablet, Oral) Active. Medications Reconciled  Social History (Tanisha A. Owens Shark, [place]; 10/24/2018 10:11 AM) No caffeine use Tobacco use Never smoker.  Other Problems (Tanisha A. Owens Shark, [place]; 10/24/2018 10:11 AM) Prostate Cancer     Review of Systems (Tanisha A. Brown RMA; 10/24/2018 10:11 AM) General Not Present- Appetite Loss, Chills, Fatigue, Fever, Night Sweats, Weight Gain and Weight Loss. Breast Not Present- Breast Mass, Breast Pain, Nipple Discharge and Skin Changes. Cardiovascular Not Present- Chest Pain, Difficulty Breathing Lying Down, Leg Cramps, Palpitations, Rapid Heart Rate, Shortness of Breath and  Swelling of Extremities. Gastrointestinal Not Present- Abdominal Pain, Bloating, Bloody Stool, Change in Bowel Habits, Chronic diarrhea, Constipation, Difficulty Swallowing, Excessive gas, Gets full quickly at meals, Hemorrhoids, Indigestion, Nausea, Rectal Pain and Vomiting.  Vitals (Tanisha A. Brown RMA; 10/24/2018 10:13 AM) 10/24/2018 10:12 AM Weight: 170.2 lb Height: 67in Body Surface Area: 1.89 m Body Mass Index: 26.66 kg/m  Temp.: 98.26F  Pulse: 94 (Regular)  BP: 128/84 (Sitting, Left Arm, Standard)      Physical Exam Rodman Key K. Jaskaran Dauzat MD; 10/24/2018 10:37 AM)  The physical exam findings are as follows: Note:WDWN in NAD Eyes: Pupils equal, round; sclera anicteric HENT: Oral mucosa moist; good dentition Neck: No masses palpated, no thyromegaly Lungs: CTA bilaterally; normal respiratory effort CV: Regular rate and rhythm; no murmurs; extremities well-perfused with no edema Abd: +bowel sounds, soft, non-tender, no palpable organomegaly; no palpable hernias GU: bilateral descended testes; no testicular masses, no right inguinal hernia; large left inguinal hernia Skin: Warm, dry; no sign of jaundice Psychiatric - alert and oriented x 4; calm mood and affect    Assessment & Plan Rodman Key K. Stratton Villwock MD; 10/24/2018 10:30 AM)  INGUINAL HERNIA OF LEFT SIDE WITHOUT OBSTRUCTION OR GANGRENE (K40.90)  Current Plans Schedule for Surgery - Left inguinal hernia repair with mesh. The surgical procedure has been discussed with the patient. Potential risks, benefits, alternative treatments, and expected outcomes have been explained. All of the patient's questions at this time have been answered. The likelihood of reaching the patient's treatment goal is good. The patient understand the proposed surgical procedure and wishes to proceed.   Stephen Craig. Georgette Dover, MD, [facility] [phone]  10/24/2018 10:38 AM

## 2022-07-12 ENCOUNTER — Ambulatory Visit (HOSPITAL_COMMUNITY)
Admission: EM | Admit: 2022-07-12 | Discharge: 2022-07-12 | Disposition: A | Discharge status: Home / Self Care | Payer: PRIVATE HEALTH INSURANCE | Attending: Family Medicine | Admitting: Family Medicine

## 2022-07-12 ENCOUNTER — Encounter (HOSPITAL_COMMUNITY): Payer: Self-pay

## 2022-07-12 ENCOUNTER — Emergency Department (HOSPITAL_COMMUNITY)
Admission: EM | Admit: 2022-07-12 | Discharge: 2022-07-13 | Discharge status: Against Medical Advice | Payer: PRIVATE HEALTH INSURANCE | Attending: Emergency Medicine | Admitting: Emergency Medicine

## 2022-07-12 ENCOUNTER — Other Ambulatory Visit: Payer: Self-pay

## 2022-07-12 DIAGNOSIS — R739 Hyperglycemia, unspecified: Secondary | ICD-10-CM | POA: Insufficient documentation

## 2022-07-12 DIAGNOSIS — E860 Dehydration: Secondary | ICD-10-CM | POA: Diagnosis not present

## 2022-07-12 DIAGNOSIS — Z5321 Procedure and treatment not carried out due to patient leaving prior to being seen by health care provider: Secondary | ICD-10-CM | POA: Insufficient documentation

## 2022-07-12 DIAGNOSIS — I959 Hypotension, unspecified: Secondary | ICD-10-CM

## 2022-07-12 LAB — URINALYSIS, ROUTINE W REFLEX MICROSCOPIC
Bilirubin Urine: NEGATIVE
Glucose, UA: >=500 mg/dL — AB
Hgb urine dipstick: NEGATIVE
Ketones, ur: 15 mg/dL — AB
Leukocytes,Ua: NEGATIVE
Nitrite: NEGATIVE
Protein, ur: NEGATIVE mg/dL
Specific Gravity, Urine: 1.01 (ref 1.005–1.030)
pH: 5.5 (ref 5.0–8.0)

## 2022-07-12 LAB — CBC
HCT: 45.9 % (ref 39.0–52.0)
Hemoglobin: 15.2 g/dL (ref 13.0–17.0)
MCH: 30.5 pg (ref 26.0–34.0)
MCHC: 33.1 g/dL (ref 30.0–36.0)
MCV: 92 fL (ref 80.0–100.0)
Platelets: 276 10*3/uL (ref 150–400)
RBC: 4.99 MIL/uL (ref 4.22–5.81)
RDW: 11.9 % (ref 11.5–15.5)
WBC: 8.3 10*3/uL (ref 4.0–10.5)
nRBC: 0 % (ref 0.0–0.2)

## 2022-07-12 LAB — CBG MONITORING, ED
Glucose-Capillary: 251 mg/dL — ABNORMAL HIGH (ref 70–99)
Glucose-Capillary: 285 mg/dL — ABNORMAL HIGH (ref 70–99)

## 2022-07-12 LAB — BASIC METABOLIC PANEL
Anion gap: 13 (ref 5–15)
BUN: 45 mg/dL — ABNORMAL HIGH (ref 8–23)
CO2: 23 mmol/L (ref 22–32)
Calcium: 9.8 mg/dL (ref 8.9–10.3)
Chloride: 102 mmol/L (ref 98–111)
Creatinine, Ser: 2.07 mg/dL — ABNORMAL HIGH (ref 0.61–1.24)
GFR, Estimated: 36 mL/min — ABNORMAL LOW (ref >60)
Glucose, Bld: 269 mg/dL — ABNORMAL HIGH (ref 70–99)
Potassium: 4.4 mmol/L (ref 3.5–5.1)
Sodium: 138 mmol/L (ref 135–145)

## 2022-07-12 LAB — URINALYSIS, MICROSCOPIC (REFLEX)
RBC / HPF: NONE SEEN RBC/hpf (ref 0–5)
Squamous Epithelial / HPF: NONE SEEN (ref 0–5)
WBC, UA: NONE SEEN WBC/hpf (ref 0–5)

## 2022-07-12 NOTE — Discharge Instructions (Signed)
Patient has been asked to proceed to the emergency room.  Vital signs are acceptable for him to go there on his own

## 2022-07-12 NOTE — ED Provider Notes (Signed)
Here for dizziness and elevated sugar.  About 3 days ago he had blood done with his primary care.  He was notified yesterday that his sugar on that lab work was 600.  He then restarted taking his metformin.  He does not check his sugars at home.  In the last few days he has been feeling dizzy and very dry mouth.  He has been drinking a lot of water.  No loss of consciousness or syncope.  No fall.  He does also take lisinopril for blood pressure.   Blood pressure here is 95/58.  He is alert and oriented and in no distress.  Fingerstick glucose here was 250.   Cannot access his lab work results in epic or in care everywhere.  With his low blood pressure and symptoms, I think he needs urgent lab evaluation and possible IV fluids.  I have asked him to proceed to the emergency room for higher level of care and evaluation then we can provide here in the urgent care setting.   Barrett Henle, MD 07/12/22 1026

## 2022-07-12 NOTE — ED Triage Notes (Signed)
Patient states he has been feeling sluggish and un well. States his provider tested his sugar and it was over 600. Test was done Friday. Has not checked himself.   Patient is currently metformin and drinking a lot of fluids.   Patient currently having dry mouth, and feeling off.

## 2022-07-12 NOTE — ED Triage Notes (Signed)
Pt arrived POV from home c/o hyperglycemia. Pt states his CBG was 600 last week and his PCP started him on news medication but told him he might need insulin now but has never taken it. Pt c/o feeling sluggish and losing 15 lbs recently.

## 2022-07-12 NOTE — ED Notes (Signed)
Patient is being discharged from the Urgent Care and sent to the Emergency Department via personal vehicle. Per Dr Windy Carina, patient is in need of higher level of care due to Hypotension. Patient is aware and verbalizes understanding of plan of care.  Vitals:   07/12/22 1015  BP: (!) 95/58  Pulse: (!) 103  Resp: 16  Temp: 97.7 F (36.5 C)  SpO2: 100%

## 2022-07-13 ENCOUNTER — Encounter (HOSPITAL_COMMUNITY): Payer: Self-pay

## 2022-07-13 ENCOUNTER — Other Ambulatory Visit: Payer: Self-pay

## 2022-07-13 ENCOUNTER — Emergency Department (HOSPITAL_COMMUNITY)
Admission: EM | Admit: 2022-07-13 | Discharge: 2022-07-13 | Disposition: A | Discharge status: Home / Self Care | Payer: PRIVATE HEALTH INSURANCE | Source: Home / Self Care | Attending: Emergency Medicine | Admitting: Emergency Medicine

## 2022-07-13 DIAGNOSIS — N179 Acute kidney failure, unspecified: Secondary | ICD-10-CM | POA: Insufficient documentation

## 2022-07-13 DIAGNOSIS — E1165 Type 2 diabetes mellitus with hyperglycemia: Secondary | ICD-10-CM | POA: Insufficient documentation

## 2022-07-13 DIAGNOSIS — I10 Essential (primary) hypertension: Secondary | ICD-10-CM | POA: Insufficient documentation

## 2022-07-13 DIAGNOSIS — E860 Dehydration: Secondary | ICD-10-CM | POA: Insufficient documentation

## 2022-07-13 DIAGNOSIS — Z79899 Other long term (current) drug therapy: Secondary | ICD-10-CM | POA: Insufficient documentation

## 2022-07-13 DIAGNOSIS — R739 Hyperglycemia, unspecified: Secondary | ICD-10-CM

## 2022-07-13 DIAGNOSIS — Z7984 Long term (current) use of oral hypoglycemic drugs: Secondary | ICD-10-CM | POA: Insufficient documentation

## 2022-07-13 LAB — CBC WITH DIFFERENTIAL/PLATELET
Abs Immature Granulocytes: 0.02 10*3/uL (ref 0.00–0.07)
Basophils Absolute: 0 10*3/uL (ref 0.0–0.1)
Basophils Relative: 0 %
Eosinophils Absolute: 0.1 10*3/uL (ref 0.0–0.5)
Eosinophils Relative: 1 %
HCT: 44.2 % (ref 39.0–52.0)
Hemoglobin: 14.5 g/dL (ref 13.0–17.0)
Immature Granulocytes: 0 %
Lymphocytes Relative: 29 %
Lymphs Abs: 2 10*3/uL (ref 0.7–4.0)
MCH: 30.3 pg (ref 26.0–34.0)
MCHC: 32.8 g/dL (ref 30.0–36.0)
MCV: 92.5 fL (ref 80.0–100.0)
Monocytes Absolute: 0.5 10*3/uL (ref 0.1–1.0)
Monocytes Relative: 7 %
Neutro Abs: 4.3 10*3/uL (ref 1.7–7.7)
Neutrophils Relative %: 63 %
Platelets: 249 10*3/uL (ref 150–400)
RBC: 4.78 MIL/uL (ref 4.22–5.81)
RDW: 12.1 % (ref 11.5–15.5)
WBC: 6.9 10*3/uL (ref 4.0–10.5)
nRBC: 0 % (ref 0.0–0.2)

## 2022-07-13 LAB — COMPREHENSIVE METABOLIC PANEL
ALT: 18 U/L (ref 0–44)
AST: 13 U/L — ABNORMAL LOW (ref 15–41)
Albumin: 3.5 g/dL (ref 3.5–5.0)
Alkaline Phosphatase: 79 U/L (ref 38–126)
Anion gap: 14 (ref 5–15)
BUN: 71 mg/dL — ABNORMAL HIGH (ref 8–23)
CO2: 20 mmol/L — ABNORMAL LOW (ref 22–32)
Calcium: 9.3 mg/dL (ref 8.9–10.3)
Chloride: 103 mmol/L (ref 98–111)
Creatinine, Ser: 2.06 mg/dL — ABNORMAL HIGH (ref 0.61–1.24)
GFR, Estimated: 36 mL/min — ABNORMAL LOW (ref >60)
Glucose, Bld: 232 mg/dL — ABNORMAL HIGH (ref 70–99)
Potassium: 4.3 mmol/L (ref 3.5–5.1)
Sodium: 137 mmol/L (ref 135–145)
Total Bilirubin: 1.2 mg/dL (ref 0.3–1.2)
Total Protein: 7.4 g/dL (ref 6.5–8.1)

## 2022-07-13 LAB — URINALYSIS, ROUTINE W REFLEX MICROSCOPIC
Bilirubin Urine: NEGATIVE
Glucose, UA: >=500 mg/dL — AB
Hgb urine dipstick: NEGATIVE
Ketones, ur: 15 mg/dL — AB
Leukocytes,Ua: NEGATIVE
Nitrite: NEGATIVE
Protein, ur: NEGATIVE mg/dL
Specific Gravity, Urine: 1.01 (ref 1.005–1.030)
pH: 5.5 (ref 5.0–8.0)

## 2022-07-13 LAB — BLOOD GAS, VENOUS
Acid-base deficit: 3.7 mmol/L — ABNORMAL HIGH (ref 0.0–2.0)
Bicarbonate: 22.1 mmol/L (ref 20.0–28.0)
O2 Saturation: 73 %
Patient temperature: 37
pCO2, Ven: 42 mmHg — ABNORMAL LOW (ref 44–60)
pH, Ven: 7.33 (ref 7.25–7.43)
pO2, Ven: 47 mmHg — ABNORMAL HIGH (ref 32–45)

## 2022-07-13 LAB — URINALYSIS, MICROSCOPIC (REFLEX)
Bacteria, UA: NONE SEEN
RBC / HPF: NONE SEEN RBC/hpf (ref 0–5)
Squamous Epithelial / HPF: NONE SEEN (ref 0–5)

## 2022-07-13 LAB — CBG MONITORING, ED
Glucose-Capillary: 257 mg/dL — ABNORMAL HIGH (ref 70–99)
Glucose-Capillary: 168 mg/dL — ABNORMAL HIGH (ref 70–99)

## 2022-07-13 MED ORDER — SODIUM CHLORIDE 0.9 % IV BOLUS
1500.0000 mL | Freq: Once | INTRAVENOUS | Status: AC
  Administered 2022-07-13: 1500 mL via INTRAVENOUS

## 2022-07-13 NOTE — ED Provider Notes (Signed)
[facility] DEPT Provider Note   CSN: [id number] Arrival date & time: 07/13/22  0301     History {Add pertinent medical, surgical, social history, OB history to HPI:1} Chief Complaint  Patient presents with   Hyperglycemia    Stephen Craig is a 61 y.o. male with past medical history significant for diabetes, hypertension, hypercholesterolemia who presents with concern for elevated blood sugar going on for several days.  Patient reports that he saw his primary care doctor last week on Thursday or Friday, had a CBG of greater than 600 at the time, was experiencing some dizziness, dry mouth, polyuria, polydipsia, reports that he has had some weight loss over the last few weeks, and some general blurring of his vision.  Today he reports he feels slightly improved with his symptoms, mostly at this time experiencing dry mouth, and frequent urination, he has history of diabetes, reports that he was having hard time getting his combination medication empagliflozin-linagliptin, and so was taking metformin 1000 mg twice daily as well as was recently started on Rybelsus.  Patient reports he has never required insulin.  Patient denies any nausea, vomiting, confusion, chest pain, shortness of breath.  He denies any double vision, sensitivity to light, facial droop, weakness, numbness.   Hyperglycemia Associated symptoms: increased thirst and polyuria        Home Medications Prior to Admission medications   Medication Sig Start Date End Date Taking? Authorizing Provider  Empagliflozin-Linagliptin (GLYXAMBI) 10-5 MG TABS Take 1 tablet by mouth daily.    [provider]  lisinopril (PRINIVIL,ZESTRIL) 20 MG tablet Take 20 mg by mouth daily.     [provider]  metFORMIN (GLUCOPHAGE) 500 MG tablet Take 1,000 mg by mouth 2 (two) times daily with a meal.    [provider]  pravastatin (PRAVACHOL) 40 MG tablet Take 80 mg by mouth daily.     [provider]      Allergies    Patient has no known allergies.    Review of Systems   Review of Systems  Endocrine: Positive for polydipsia and polyuria.  All other systems reviewed and are negative.   Physical Exam Updated Vital Signs BP 102/72   Pulse 81   Temp 98.5 F (36.9 C) (Oral)   Resp 18   SpO2 94%  Physical Exam Vitals and nursing note reviewed.  Constitutional:      General: He is not in acute distress.    Appearance: Normal appearance.  HENT:     Head: Normocephalic and atraumatic.     Mouth/Throat:     Mouth: Mucous membranes are dry.  Eyes:     General:        Right eye: No discharge.        Left eye: No discharge.  Cardiovascular:     Rate and Rhythm: Normal rate and regular rhythm.     Heart sounds: No murmur heard.    No friction rub. No gallop.  Pulmonary:     Effort: Pulmonary effort is normal.     Breath sounds: Normal breath sounds.  Abdominal:     General: Bowel sounds are normal.     Palpations: Abdomen is soft.     Comments: No ttp abdomen  Skin:    General: Skin is warm and dry.     Capillary Refill: Capillary refill takes less than 2 seconds.  Neurological:     Mental Status: He is alert and oriented to person, place, and time.  Psychiatric:        Mood and Affect: Mood normal.        Behavior: Behavior normal.     ED Results / Procedures / Treatments   Labs (all labs ordered are listed, but only abnormal results are displayed) Labs Reviewed  COMPREHENSIVE METABOLIC PANEL - Abnormal; Notable for the following components:      Result Value   CO2 20 (*)    Glucose, Bld 232 (*)    BUN 71 (*)    Creatinine, Ser 2.06 (*)    AST 13 (*)    GFR, Estimated 36 (*)    All other components within normal limits  BLOOD GAS, VENOUS - Abnormal; Notable for the following components:   pCO2, Ven 42 (*)    pO2, Ven 47 (*)    Acid-base deficit 3.7 (*)    All other components within normal limits  URINALYSIS, ROUTINE W REFLEX  MICROSCOPIC - Abnormal; Notable for the following components:   Glucose, UA >=500 (*)    Ketones, ur 15 (*)    All other components within normal limits  CBG MONITORING, ED - Abnormal; Notable for the following components:   Glucose-Capillary 257 (*)    All other components within normal limits  CBG MONITORING, ED - Abnormal; Notable for the following components:   Glucose-Capillary 168 (*)    All other components within normal limits  CBC WITH DIFFERENTIAL/PLATELET  URINALYSIS, MICROSCOPIC (REFLEX)    EKG None  Radiology No results found.  Procedures Procedures  {Document cardiac monitor, telemetry assessment procedure when appropriate:1}  Medications Ordered in ED Medications  sodium chloride 0.9 % bolus 1,500 mL (0 mLs Intravenous Stopped 07/13/22 0951)    ED Course/ Medical Decision Making/ A&P                           Medical Decision Making  This patient is a 61 y.o. male who presents to the ED for concern of hyperglycemia, poorly controlled blood sugar, this involves an extensive number of treatment options, and is a complaint that carries with it a high risk of complications and morbidity. The emergent differential diagnosis prior to evaluation includes, but is not limited to, DKA, HHS, or other hyperglycemic crisis, underlying infection or other abnormality causing hyperglycemia, adrenal crisis, Cushing syndrome, versus other.   This is not an exhaustive differential.   Past Medical History / Co-morbidities / Social History: ***  Additional history: Chart reviewed. Pertinent results include: ***  Physical Exam: Physical exam performed. The pertinent findings include: ***  Lab Tests: I ordered, and personally interpreted labs.  The pertinent results include:  ***   Imaging Studies: I ordered imaging studies including ***. I independently visualized and interpreted imaging which showed ***. I agree with the radiologist interpretation.   Cardiac Monitoring:   The patient was maintained on a cardiac monitor.  My attending physician Dr. Marland Kitchen viewed and interpreted the cardiac monitored which showed an underlying rhythm of: ***. I agree with this interpretation.   Medications: I ordered medication including ***  for ***. Reevaluation of the patient after these medicines showed that the patient {resolved/improved/worsened:23923::"improved"}. I have reviewed the patients home medicines and have made adjustments as needed.  Consultations Obtained: I requested consultation with the ***,  and discussed lab and imaging findings as well as pertinent plan - they recommend: ***   Disposition: After consideration of the diagnostic results and the patients response to treatment, I feel  that *** .   ***emergency department workup does not suggest an emergent condition requiring admission or immediate intervention beyond what has been performed at this time. The plan is: ***. The patient is safe for discharge and has been instructed to return immediately for worsening symptoms, change in symptoms or any other concerns.  I discussed this case with my attending physician Dr. Marland Kitchen who cosigned this note including patient's presenting symptoms, physical exam, and planned diagnostics and interventions. Attending physician stated agreement with plan or made changes to plan which were implemented.    Final Clinical Impression(s) / ED Diagnoses Final diagnoses:  Hyperglycemia  AKI (acute kidney injury) ([place])    Rx / DC Orders ED Discharge Orders     None

## 2022-07-13 NOTE — Discharge Instructions (Signed)
Please focus on drinking plenty of fluids and including electrolyte-containing fluids such as Gatorade, Pedialyte over the next several days, and continue to monitor blood sugar, continue taking your metformin, Januvia, and Rybelsus as directed, and try to discuss with your primary care doctor as early as possible whether or not they would like you to begin taking the previous medication Glyxambi again since your blood sugars seem to be under better control on this medication in the past.  Additionally I recommend having a recheck of your lab work with your primary care doctor since your kidney function was somewhat elevated today.  Please return to the emergency department if you have return of poorly controlled blood sugar, worsening dizziness, lightheadedness, nausea, vomiting associated with high blood sugar.

## 2022-07-13 NOTE — ED Triage Notes (Signed)
Pt complaining of high blood sugar has been going on for the week or so.  Last cbg 600 started on metformin and rebelsus last week, which is not helping.

## 2022-11-10 LAB — HM HIV SCREENING LAB: HM HIV Screening: NEGATIVE

## 2022-11-17 LAB — LAB REPORT - SCANNED: Chlamydia, Swab/Urine, PCR: NEGATIVE

## 2022-12-01 ENCOUNTER — Ambulatory Visit: Payer: PRIVATE HEALTH INSURANCE | Admitting: Internal Medicine

## 2023-01-03 ENCOUNTER — Encounter: Payer: Self-pay | Admitting: Infectious Disease

## 2023-01-03 DIAGNOSIS — A530 Latent syphilis, unspecified as early or late: Secondary | ICD-10-CM

## 2023-01-03 NOTE — Progress Notes (Unsigned)
Subjective:  Reason for chest disease consult: Latent syphilis  Requesting physician: Narda Rutherford, MD   Patient ID: Stephen Craig, male    DOB: [birth year], 62 y.o.   MRN: [id number]  HPI  62 year old Black man who it appears presented to primary care with lesions on his penis. These appear highly c/w genital warts (and he has Aldara cream for 2 weeks for this though without resolution), he was also tested for HIV viral hepatitides gonorrhea and chlamydia with all test negative but his syphilis test came back positive with a positive RPR and a titer of 1-32.  Treatment will antibody was also confirmatory.  He does endorse having sex with both men and women, and to using internet for some of the times in particularly recently that he has had sex with another man.  Not surprisingly he like many people can be was a bit "shy/ embarassed?" about disclosing his sexual history with me.   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    Social History   Socioeconomic History   Marital status: Legally Separated    Spouse name: Not on file   Number of children: Not on file   Years of education: Not on file   Highest education level: Not on file  Occupational History   Not on file  Tobacco  Use   Smoking status: Never   Smokeless tobacco: Never  Vaping Use   Vaping Use: Never used  Substance and Sexual Activity   Alcohol use: No   Drug use: No   Sexual activity: Not on file  Other Topics Concern   Not on file  Social History Narrative   Not on file   Social Determinants of Health   Financial Resource Strain: Not on file  Food Insecurity: Not on file  Transportation Needs: Not on file  Physical Activity: Not on file  Stress: Not on file  Social Connections: Not on file    No Known Allergies   Current Outpatient Medications:    Empagliflozin-Linagliptin (GLYXAMBI) 10-5 MG TABS, Take 1 tablet by mouth daily., Disp: , Rfl:    lisinopril (PRINIVIL,ZESTRIL) 20 MG tablet, Take 20 mg by mouth daily. , Disp: , Rfl:    metFORMIN (GLUCOPHAGE) 500 MG tablet, Take 1,000 mg by mouth 2 (two) times daily with a meal., Disp: , Rfl:    pravastatin (PRAVACHOL) 40 MG tablet, Take 80 mg by mouth daily., Disp: , Rfl:    Review of Systems  Constitutional:  Negative for activity change, appetite change, chills, diaphoresis, fatigue, fever and unexpected weight change.  HENT:  Negative for congestion, rhinorrhea, sinus pressure, sneezing, sore throat and trouble swallowing.   Eyes:  Negative for photophobia and visual disturbance.  Respiratory:  Negative for cough, chest tightness, shortness of breath, wheezing and stridor.   Cardiovascular:  Negative for chest pain, palpitations and leg swelling.  Gastrointestinal:  Negative for abdominal distention, abdominal pain, anal bleeding, blood in stool, constipation, diarrhea, nausea and vomiting.  Genitourinary:  Positive for genital sores. Negative for difficulty urinating, dysuria, flank pain and hematuria.  Musculoskeletal:  Negative for arthralgias, back pain, gait problem, joint swelling and myalgias.  Skin:  Negative for color change, pallor, rash and wound.  Neurological:  Negative for dizziness, tremors, weakness and  light-headedness.  Hematological:  Negative for adenopathy. Does not bruise/bleed easily.  Psychiatric/Behavioral:  Negative for agitation, behavioral problems, confusion, decreased concentration, dysphoric mood and sleep disturbance.        Objective:   Physical Exam Nursing note reviewed. Exam conducted with a chaperone present.  Constitutional:      General: He is not in acute distress.    Appearance: Normal appearance. He is well-developed. He is not ill-appearing or diaphoretic.  HENT:     Head: Normocephalic and atraumatic.     Right Ear: Hearing and external ear normal.     Left Ear: Hearing and external ear normal.     Nose: No nasal deformity or rhinorrhea.  Eyes:     General: No scleral icterus.    Conjunctiva/sclera: Conjunctivae normal.     Right eye: Right conjunctiva is not injected.     Left eye: Left conjunctiva is not injected.     Pupils: Pupils are equal, round, and reactive to light.  Neck:     Vascular: No JVD.  Cardiovascular:     Rate and Rhythm: Normal rate and regular rhythm.     Heart sounds: S1 normal and S2 normal.  Abdominal:     General: Bowel sounds are normal. There is no distension.     Palpations: Abdomen is soft.  Genitourinary:    Penis: Circumcised.      Comments: Genital warts Musculoskeletal:        General: Normal range of motion.     Right shoulder: Normal.     Left shoulder: Normal.     Cervical back: Normal range of motion and neck supple.     Right hip: Normal.     Left hip: Normal.     Right knee: Normal.     Left knee: Normal.  Lymphadenopathy:     Head:     Right side of head: No submandibular, preauricular or posterior auricular adenopathy.     Left side of head: No submandibular, preauricular or posterior auricular adenopathy.     Cervical: No cervical adenopathy.     Right cervical: No superficial or deep cervical adenopathy.    Left cervical: No superficial or deep cervical adenopathy.  Skin:    General: Skin is  warm and dry.     Coloration: Skin is not pale.     Findings: No abrasion, bruising, ecchymosis, erythema, lesion or rash.     Nails: There is no clubbing.  Neurological:     Mental Status: He is alert and oriented to person, place, and time.     Sensory: No sensory deficit.     Coordination: Coordination normal.     Gait: Gait normal.  Psychiatric:        Attention and Perception: He is attentive.        Mood and Affect: Mood normal.        Speech: Speech normal.        Behavior: Behavior  normal. Behavior is cooperative.        Thought Content: Thought content normal.        Judgment: Judgment normal.           Assessment & Plan:   Latent syphlils: Recheck with the health department he only received 1 4,000,000 units of penicillin x 1 not x 3.  Therefore we will restart him and give him a dose of 2,400,000 units today a week from today and week afterwards.  HIV preexposure preop prophylaxis:  He is at exceedingly high risk with recent STI, in particular syphilis, with being a Black man living in the [place] and having sex with men at times through online services  We will recheck HIV antibody, and HIV RNA, today and I will send in rx for Descovy (if it is covered if not then Truvada) but endeavor to get him onto Apretude with dosing q 43M and superiorority to oral therapy  Genital warts: Given prescription for Aldara cream and should consider screening for anal cancer with anal Pap smears  I have personally spent 82 minutes involved in face-to-face and non-face-to-face activities for this patient on the day of the visit. Professional time spent includes the following activities: Preparing to see the patient (review of tests), Obtaining and/or reviewing separately obtained history (admission/discharge record), Performing a medically appropriate examination and/or evaluation , Ordering medications/tests/procedures, referring and communicating with other health care  professionals, Documenting clinical information in the EMR, Independently interpreting results (not separately reported), Communicating results to the patient/family/caregiver, Counseling and educating the patient/family/caregiver and Care coordination (not separately reported).

## 2023-01-04 ENCOUNTER — Ambulatory Visit (INDEPENDENT_AMBULATORY_CARE_PROVIDER_SITE_OTHER): Payer: PRIVATE HEALTH INSURANCE | Admitting: Infectious Disease

## 2023-01-04 ENCOUNTER — Other Ambulatory Visit: Payer: Self-pay

## 2023-01-04 ENCOUNTER — Other Ambulatory Visit (HOSPITAL_COMMUNITY): Payer: Self-pay

## 2023-01-04 ENCOUNTER — Other Ambulatory Visit: Payer: Self-pay | Admitting: Infectious Disease

## 2023-01-04 ENCOUNTER — Encounter: Payer: Self-pay | Admitting: Infectious Disease

## 2023-01-04 ENCOUNTER — Other Ambulatory Visit (HOSPITAL_COMMUNITY)
Admission: RE | Admit: 2023-01-04 | Discharge: 2023-01-04 | Disposition: A | Discharge status: Home / Self Care | Payer: PRIVATE HEALTH INSURANCE | Source: Ambulatory Visit | Attending: Infectious Disease | Admitting: Infectious Disease

## 2023-01-04 VITALS — BP 163/91 | HR 75 | Temp 98.0°F | Resp 16 | Ht 67.0 in | Wt 158.2 lb

## 2023-01-04 DIAGNOSIS — Z7189 Other specified counseling: Secondary | ICD-10-CM

## 2023-01-04 DIAGNOSIS — A530 Latent syphilis, unspecified as early or late: Secondary | ICD-10-CM | POA: Diagnosis present

## 2023-01-04 DIAGNOSIS — Z2981 Encounter for HIV pre-exposure prophylaxis: Secondary | ICD-10-CM | POA: Insufficient documentation

## 2023-01-04 DIAGNOSIS — A630 Anogenital (venereal) warts: Secondary | ICD-10-CM | POA: Insufficient documentation

## 2023-01-04 MED ORDER — EMTRICITABINE-TENOFOVIR DF 200-300 MG PO TABS
1.0000 | ORAL_TABLET | Freq: Every day | ORAL | 0 refills | Status: AC
  Filled 2023-01-04 (×2): qty 30, 30d supply, fill #0

## 2023-01-04 MED ORDER — IMIQUIMOD 5 % EX CREA
TOPICAL_CREAM | CUTANEOUS | 2 refills | Status: AC
  Filled 2023-01-04: qty 12, fill #0
  Filled 2023-01-05: qty 12, 30d supply, fill #0
  Filled 2023-02-22: qty 12, 30d supply, fill #1
  Filled 2023-04-12: qty 12, 30d supply, fill #2

## 2023-01-04 MED ORDER — EMTRICITABINE-TENOFOVIR AF 200-25 MG PO TABS
1.0000 | ORAL_TABLET | Freq: Every day | ORAL | 0 refills | Status: DC
  Filled 2023-01-04: qty 30, 30d supply, fill #0

## 2023-01-04 MED ORDER — PENICILLIN G BENZATHINE 1200000 UNIT/2ML IM SUSY
2.4000 10*6.[IU] | PREFILLED_SYRINGE | Freq: Once | INTRAMUSCULAR | Status: AC
  Administered 2023-01-04: 2.4 10*6.[IU] via INTRAMUSCULAR

## 2023-01-04 NOTE — Progress Notes (Signed)
Patient seeing Dr. Daiva Eves for syphilis treatment. States he has sex with men and women but mostly men as of late. Discussed all PrEP options with him and he prefers to start Apretude.   Counseled that Apretude is one intramuscular injection in the gluteal muscle for each visit. Explained that the second injection is 30 days after the initial injection then every 2 months thereafter. Explained that showing up to injection appointments is very important and warned that if appointments are missed, protection will be minimal and the risk of acquiring HIV becomes much higher. Counseled on possible side effects associated with the injections such as injection site pain, which is usually mild to moderate in nature, injection site nodules, and injection site reactions. Advised that he can take Motrin or Tylenol for injection site pain if needed.   Dr. Daiva Eves will start him on Truvada while we work on the PA for Apretude.  Azzan Butler L. Dat Derksen, PharmD, BCIDP, AAHIVP, CPP Clinical Pharmacist Practitioner Infectious Diseases Clinical Pharmacist [facility] 01/04/2023, 10:01 AM

## 2023-01-05 ENCOUNTER — Other Ambulatory Visit (HOSPITAL_COMMUNITY): Payer: Self-pay

## 2023-01-05 LAB — URINE CYTOLOGY ANCILLARY ONLY
Chlamydia: NEGATIVE
Comment: NEGATIVE
Comment: NORMAL
Neisseria Gonorrhea: NEGATIVE

## 2023-01-07 LAB — HIV ANTIBODY (ROUTINE TESTING W REFLEX): HIV 1&2 Ab, 4th Generation: NONREACTIVE

## 2023-01-07 LAB — SYPHILIS: RPR W/REFLEX TO RPR TITER AND TREPONEMAL ANTIBODIES, TRADITIONAL SCREENING AND DIAGNOSIS ALGORITHM: RPR Ser Ql: REACTIVE — AB

## 2023-01-07 LAB — T PALLIDUM AB: T Pallidum Abs: POSITIVE — AB

## 2023-01-07 LAB — HIV-1 RNA QUANT-NO REFLEX-BLD
HIV 1 RNA Quant: NOT DETECTED Copies/mL
HIV-1 RNA Quant, Log: NOT DETECTED Log cps/mL

## 2023-01-07 LAB — HEPATITIS PANEL, ACUTE
Hep A IgM: NONREACTIVE
Hep B C IgM: NONREACTIVE
Hepatitis B Surface Ag: NONREACTIVE
Hepatitis C Ab: NONREACTIVE

## 2023-01-07 LAB — RPR TITER: RPR Titer: 1:16 {titer} — ABNORMAL HIGH

## 2023-01-11 ENCOUNTER — Ambulatory Visit: Payer: PRIVATE HEALTH INSURANCE

## 2023-01-11 ENCOUNTER — Other Ambulatory Visit: Payer: Self-pay

## 2023-01-11 ENCOUNTER — Telehealth: Payer: Self-pay

## 2023-01-11 ENCOUNTER — Ambulatory Visit (INDEPENDENT_AMBULATORY_CARE_PROVIDER_SITE_OTHER): Payer: PRIVATE HEALTH INSURANCE

## 2023-01-11 DIAGNOSIS — A530 Latent syphilis, unspecified as early or late: Secondary | ICD-10-CM

## 2023-01-11 MED ORDER — PENICILLIN G BENZATHINE 1200000 UNIT/2ML IM SUSY
2.4000 10*6.[IU] | PREFILLED_SYRINGE | Freq: Once | INTRAMUSCULAR | Status: AC
  Administered 2023-01-11: 2.4 10*6.[IU] via INTRAMUSCULAR

## 2023-01-11 NOTE — Telephone Encounter (Signed)
[facility] Patient Advocate Encounter   Received notification from [employer] that prior authorization for Apretude is required.   PA submitted on 01/11/23 Key [id number] Status is pending    [facility] will continue to follow.   Clearance Coots, CPhT Specialty Pharmacy Patient [facility] Phone: [phone] Fax:  [phone]

## 2023-01-18 ENCOUNTER — Ambulatory Visit: Payer: PRIVATE HEALTH INSURANCE

## 2023-01-18 ENCOUNTER — Ambulatory Visit (INDEPENDENT_AMBULATORY_CARE_PROVIDER_SITE_OTHER): Payer: PRIVATE HEALTH INSURANCE

## 2023-01-18 ENCOUNTER — Other Ambulatory Visit: Payer: Self-pay

## 2023-01-18 DIAGNOSIS — A530 Latent syphilis, unspecified as early or late: Secondary | ICD-10-CM

## 2023-01-18 MED ORDER — PENICILLIN G BENZATHINE 1200000 UNIT/2ML IM SUSY
2.4000 10*6.[IU] | PREFILLED_SYRINGE | Freq: Once | INTRAMUSCULAR | Status: AC
  Administered 2023-01-18: 2.4 10*6.[IU] via INTRAMUSCULAR

## 2023-01-21 ENCOUNTER — Telehealth: Payer: Self-pay

## 2023-01-21 NOTE — Telephone Encounter (Signed)
Thank you Donna 

## 2023-01-21 NOTE — Telephone Encounter (Signed)
[facility] Patient Advocate Encounter  I called [employer] at [phone] and was told Apretude is excluded from the patient pharmacy and medical plan.  I was requested to call [employer] at [phone], then was given a worker name by Sharyne Peach that is his coordinator they will help get the medication through patient assistance. Her # is [phone], she will fax me over some paperwork for the Dr to sign & stay in contact until the patient is approved.  Clearance Coots, CPhT Specialty Pharmacy Patient [facility] Phone: [phone] Fax:  [phone]

## 2023-01-22 ENCOUNTER — Ambulatory Visit: Payer: PRIVATE HEALTH INSURANCE

## 2023-01-27 ENCOUNTER — Other Ambulatory Visit (HOSPITAL_COMMUNITY): Payer: Self-pay

## 2023-01-29 ENCOUNTER — Other Ambulatory Visit (HOSPITAL_COMMUNITY): Payer: Self-pay

## 2023-02-01 ENCOUNTER — Other Ambulatory Visit (HOSPITAL_COMMUNITY): Payer: Self-pay

## 2023-02-03 ENCOUNTER — Other Ambulatory Visit (HOSPITAL_COMMUNITY): Payer: Self-pay

## 2023-02-04 ENCOUNTER — Ambulatory Visit: Payer: PRIVATE HEALTH INSURANCE | Admitting: Infectious Disease

## 2023-02-04 ENCOUNTER — Other Ambulatory Visit (HOSPITAL_COMMUNITY): Payer: Self-pay

## 2023-02-16 ENCOUNTER — Telehealth: Payer: Self-pay

## 2023-02-16 NOTE — Telephone Encounter (Signed)
Received voicemail from Merrill requesting refill for Imiquimod. Called him back and let him know he has refills on file and to call the pharmacy to let them know he needs a refill.   Sandie Ano, RN

## 2023-02-22 ENCOUNTER — Other Ambulatory Visit: Payer: Self-pay

## 2023-02-22 ENCOUNTER — Other Ambulatory Visit (HOSPITAL_COMMUNITY): Payer: Self-pay

## 2023-02-27 LAB — LAB REPORT - SCANNED
A1c: 10
Creatinine, POC: 57 mg/dL
EGFR: 52
Microalb Creat Ratio: 53.1
Microalbumin, Urine: 3.04

## 2023-03-11 ENCOUNTER — Other Ambulatory Visit (HOSPITAL_COMMUNITY): Payer: Self-pay

## 2023-03-19 ENCOUNTER — Other Ambulatory Visit (HOSPITAL_COMMUNITY): Payer: Self-pay

## 2023-03-25 ENCOUNTER — Other Ambulatory Visit (HOSPITAL_COMMUNITY): Payer: Self-pay

## 2023-04-08 ENCOUNTER — Other Ambulatory Visit (HOSPITAL_COMMUNITY): Payer: Self-pay

## 2023-04-12 ENCOUNTER — Other Ambulatory Visit (HOSPITAL_COMMUNITY): Payer: Self-pay

## 2023-04-16 ENCOUNTER — Other Ambulatory Visit (HOSPITAL_COMMUNITY): Payer: Self-pay

## 2023-04-22 ENCOUNTER — Other Ambulatory Visit (HOSPITAL_COMMUNITY): Payer: Self-pay

## 2023-05-12 ENCOUNTER — Other Ambulatory Visit (HOSPITAL_COMMUNITY): Payer: Self-pay

## 2023-05-20 ENCOUNTER — Other Ambulatory Visit (HOSPITAL_COMMUNITY): Payer: Self-pay

## 2023-06-03 ENCOUNTER — Other Ambulatory Visit (HOSPITAL_COMMUNITY): Payer: Self-pay

## 2023-06-04 ENCOUNTER — Other Ambulatory Visit: Payer: Self-pay

## 2024-02-29 ENCOUNTER — Telehealth: Payer: Self-pay | Admitting: Pharmacist

## 2024-02-29 NOTE — Progress Notes (Addendum)
   02/29/2024  Patient ID: Stephen Craig, male   DOB: February 14, 1961, 63 y.o.   MRN: [id number]  Referral from Dr. Leonel for DM initial visit scheduling due to A1c of 12.2%  Patient was identified for DM counseling based on A1c greater than 8% on last lab results.   Tried calling patient today to schedule free DM initial visit me as the pharmacist, with all changes made in collaboration with the PCP. Left HIPAA compliant voicemail requesting call back at earliest convenience to schedule. If patient returns call to office, you can transfer them to my line at [phone]. Thank you!  Attempt #1 (out of 3). Will try again in 1 week.   Visit prep:  Taking: Metformin  1000mg  twice a day (on max dose), Lantus 22U daily now Questionable: Jardiance  25mg  daily, Rybelsus?? (no PAP listed)  Tried: Repaglinide (2023)  A1c 12.2% *Metformin  also past due for pick-up  Update from 6/24: Attempt #2. Unable to reach and LVM again. Will try again in 1 week.   Update from 7/7: Attempt #3. Unable to reach LVM again. No further attempts. Message sent to Dr. Leonel.    Aloysius Lewis, PharmD [facility]  Phone Number: [phone]
